# Patient Record
Sex: Female | Born: 1990
Health system: Southern US, Community
[De-identification: ages and names within clinical notes are randomized; demographics above are authoritative.]

## PROBLEM LIST (undated history)

## (undated) DIAGNOSIS — F909 Attention-deficit hyperactivity disorder, unspecified type: Secondary | ICD-10-CM

## (undated) DIAGNOSIS — J069 Acute upper respiratory infection, unspecified: Secondary | ICD-10-CM

## (undated) DIAGNOSIS — L309 Dermatitis, unspecified: Secondary | ICD-10-CM

## (undated) HISTORY — DX: Acute upper respiratory infection, unspecified: J06.9

## (undated) HISTORY — PX: ANKLE SURGERY: SHX546

## (undated) HISTORY — DX: Dermatitis, unspecified: L30.9

## (undated) HISTORY — PX: ADENOIDECTOMY: SUR15

---
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
## 2013-12-12 ENCOUNTER — Encounter (HOSPITAL_BASED_OUTPATIENT_CLINIC_OR_DEPARTMENT_OTHER): Payer: Self-pay | Admitting: Emergency Medicine

## 2013-12-12 ENCOUNTER — Emergency Department (HOSPITAL_BASED_OUTPATIENT_CLINIC_OR_DEPARTMENT_OTHER)
Admission: EM | Admit: 2013-12-12 | Discharge: 2013-12-13 | Disposition: A | Discharge status: Home / Self Care | Payer: Medicaid Other | Attending: Emergency Medicine | Admitting: Emergency Medicine

## 2013-12-12 ENCOUNTER — Emergency Department (HOSPITAL_BASED_OUTPATIENT_CLINIC_OR_DEPARTMENT_OTHER): Payer: Medicaid Other

## 2013-12-12 DIAGNOSIS — Z8659 Personal history of other mental and behavioral disorders: Secondary | ICD-10-CM | POA: Insufficient documentation

## 2013-12-12 DIAGNOSIS — R1031 Right lower quadrant pain: Secondary | ICD-10-CM | POA: Insufficient documentation

## 2013-12-12 DIAGNOSIS — N760 Acute vaginitis: Secondary | ICD-10-CM | POA: Insufficient documentation

## 2013-12-12 DIAGNOSIS — F17200 Nicotine dependence, unspecified, uncomplicated: Secondary | ICD-10-CM | POA: Insufficient documentation

## 2013-12-12 DIAGNOSIS — B9689 Other specified bacterial agents as the cause of diseases classified elsewhere: Secondary | ICD-10-CM | POA: Insufficient documentation

## 2013-12-12 DIAGNOSIS — A499 Bacterial infection, unspecified: Secondary | ICD-10-CM | POA: Insufficient documentation

## 2013-12-12 DIAGNOSIS — Z79899 Other long term (current) drug therapy: Secondary | ICD-10-CM | POA: Insufficient documentation

## 2013-12-12 DIAGNOSIS — N898 Other specified noninflammatory disorders of vagina: Secondary | ICD-10-CM | POA: Insufficient documentation

## 2013-12-12 DIAGNOSIS — Z3202 Encounter for pregnancy test, result negative: Secondary | ICD-10-CM | POA: Insufficient documentation

## 2013-12-12 DIAGNOSIS — J45909 Unspecified asthma, uncomplicated: Secondary | ICD-10-CM | POA: Insufficient documentation

## 2013-12-12 LAB — URINALYSIS, ROUTINE W REFLEX MICROSCOPIC
BILIRUBIN URINE: NEGATIVE
GLUCOSE, UA: NEGATIVE mg/dL
KETONES UR: NEGATIVE mg/dL
LEUKOCYTES UA: NEGATIVE
Nitrite: NEGATIVE
PROTEIN: NEGATIVE mg/dL
Specific Gravity, Urine: 1.021 (ref 1.005–1.030)
Urobilinogen, UA: 1 mg/dL (ref 0.0–1.0)
pH: 6.5 (ref 5.0–8.0)

## 2013-12-12 LAB — WET PREP, GENITAL
TRICH WET PREP: NONE SEEN
YEAST WET PREP: NONE SEEN

## 2013-12-12 LAB — URINE MICROSCOPIC-ADD ON

## 2013-12-12 LAB — PREGNANCY, URINE: Preg Test, Ur: NEGATIVE

## 2013-12-12 MED ORDER — METRONIDAZOLE 500 MG PO TABS: 500.0000 mg | ORAL_TABLET | Freq: Two times a day (BID) | ORAL | Status: DC

## 2013-12-12 MED ORDER — IOHEXOL 300 MG/ML  SOLN
100.0000 mL | Freq: Once | INTRAMUSCULAR | Status: AC | PRN
  Administered 2013-12-12: 100 mL via INTRAVENOUS

## 2013-12-12 NOTE — ED Provider Notes (Signed)
CSN: [id number]     Arrival date & time 12/12/13  1729 History   First MD Initiated Contact with Patient 12/12/13 1904     Chief Complaint  Patient presents with  . Abdominal Pain   (Consider location/radiation/quality/duration/timing/severity/associated sxs/prior Treatment) Patient is a 23 y.o. female presenting with abdominal pain. The history is provided by the patient. No language interpreter was used.  Abdominal Pain Pain location:  RLQ Pain quality: gnawing   Pain radiates to:  Does not radiate Pain severity:  Mild Duration:  1 week Timing:  Intermittent Progression:  Waxing and waning Chronicity:  New Associated symptoms: vaginal discharge     Past Medical History  Diagnosis Date  . Asthma   . ADHD (attention deficit hyperactivity disorder)    Past Surgical History  Procedure Laterality Date  . Ankle surgery     No family history on file. History  Substance Use Topics  . Smoking status: Current Some Day Smoker -- 0.50 packs/day    Types: Cigarettes  . Smokeless tobacco: Not on file  . Alcohol Use: Yes   OB History   Grav Para Term Preterm Abortions TAB SAB Ect Mult Living                 Review of Systems  Gastrointestinal: Positive for abdominal pain.  Genitourinary: Positive for vaginal discharge.  All other systems reviewed and are negative.    Allergies  Review of patient's allergies indicates no known allergies.  Home Medications   Current Outpatient Rx  Name  Route  Sig  Dispense  Refill  . albuterol (PROVENTIL HFA;VENTOLIN HFA) 108 (90 BASE) MCG/ACT inhaler   Inhalation   Inhale 2 puffs into the lungs every 6 (six) hours as needed. For wheezing         . diphenhydramine-acetaminophen (TYLENOL PM) 25-500 MG TABS   Oral   Take 1 tablet by mouth at bedtime as needed. For pain         . PRESCRIPTION MEDICATION   Oral   Take 1 tablet by mouth daily. Birth control          BP 122/60  Pulse 71  Temp(Src) 98.7 F (37.1 C) (Oral)   Resp 20  Ht 5\' 5"  (1.651 m)  Wt 220 lb (99.791 kg)  BMI 36.61 kg/m2  SpO2 100%  LMP 11/20/2013 Physical Exam  Nursing note and vitals reviewed. Constitutional: She is oriented to person, place, and time. She appears well-developed and well-nourished.  HENT:  Head: Normocephalic.  Eyes: Pupils are equal, round, and reactive to light.  Neck: Normal range of motion.  Cardiovascular: Normal rate and regular rhythm.   Pulmonary/Chest: Effort normal and breath sounds normal.  Abdominal: Soft. There is tenderness. There is rebound.  Genitourinary: Cervix exhibits no motion tenderness. Right adnexum displays no tenderness. Left adnexum displays no tenderness. Vaginal discharge found.  Musculoskeletal: She exhibits no edema and no tenderness.  Lymphadenopathy:    She has no cervical adenopathy.  Neurological: She is alert and oriented to person, place, and time.  Skin: Skin is warm and dry.  Psychiatric: She has a normal mood and affect. Her behavior is normal. Judgment and thought content normal.    ED Course  Procedures (including critical care time) Labs Review Labs Reviewed  URINALYSIS, ROUTINE W REFLEX MICROSCOPIC - Abnormal; Notable for the following:    APPearance CLOUDY (*)    Hgb urine dipstick TRACE (*)    All other components within normal limits  PREGNANCY, URINE  URINE MICROSCOPIC-ADD ON   Imaging Review No results found.  EKG Interpretation   None     Patient with RLQ pain with rebound.  No adnexal pain on pelvic exam.  CT scan ordered to further evaluate.  Radiology results reviewed and shared with patient.   Other than left renal lesion finding, CT without acute findings.  Patient will follow-up with her provider at the health department to schedule a MRI to evaluate the left renal lesion.  MDM  Abdominal pain. Bacterial vaginosis.    Jimmye Norman, NP 12/12/13 [phone]

## 2013-12-12 NOTE — ED Notes (Addendum)
Patient transported to CT.  Pt in NAD.  No change in pt condition. Pt ambulated to CT.

## 2013-12-12 NOTE — ED Provider Notes (Signed)
Medical screening examination/treatment/procedure(s) were performed by non-physician practitioner and as supervising physician I was immediately available for consultation/collaboration.  EKG Interpretation   None         Layla MawKristen N Atoya Andrew, DO 12/12/13 2336

## 2013-12-12 NOTE — ED Notes (Addendum)
Abdominal pain for a week. Urgency. Vaginal discharge.

## 2013-12-12 NOTE — Discharge Instructions (Signed)
Abdominal Pain, Women °Abdominal (stomach, pelvic, or belly) pain can be caused by many things. It is important to tell your doctor: °· The location of the pain. °· Does it come and go or is it present all the time? °· Are there things that start the pain (eating certain foods, exercise)? °· Are there other symptoms associated with the pain (fever, nausea, vomiting, diarrhea)? °All of this is helpful to know when trying to find the cause of the pain. °CAUSES  °· Stomach: virus or bacteria infection, or ulcer. °· Intestine: appendicitis (inflamed appendix), regional ileitis (Crohn's disease), ulcerative colitis (inflamed colon), irritable bowel syndrome, diverticulitis (inflamed diverticulum of the colon), or cancer of the stomach or intestine. °· Gallbladder disease or stones in the gallbladder. °· Kidney disease, kidney stones, or infection. °· Pancreas infection or cancer. °· Fibromyalgia (pain disorder). °· Diseases of the female organs: °· Uterus: fibroid (non-cancerous) tumors or infection. °· Fallopian tubes: infection or tubal pregnancy. °· Ovary: cysts or tumors. °· Pelvic adhesions (scar tissue). °· Endometriosis (uterus lining tissue growing in the pelvis and on the pelvic organs). °· Pelvic congestion syndrome (female organs filling up with blood just before the menstrual period). °· Pain with the menstrual period. °· Pain with ovulation (producing an egg). °· Pain with an IUD (intrauterine device, birth control) in the uterus. °· Cancer of the female organs. °· Functional pain (pain not caused by a disease, may improve without treatment). °· Psychological pain. °· Depression. °DIAGNOSIS  °Your doctor will decide the seriousness of your pain by doing an examination. °· Blood tests. °· X-rays. °· Ultrasound. °· CT scan (computed tomography, special type of X-ray). °· MRI (magnetic resonance imaging). °· Cultures, for infection. °· Barium enema (dye inserted in the large intestine, to better view it with  X-rays). °· Colonoscopy (looking in intestine with a lighted tube). °· Laparoscopy (minor surgery, looking in abdomen with a lighted tube). °· Major abdominal exploratory surgery (looking in abdomen with a large incision). °TREATMENT  °The treatment will depend on the cause of the pain.  °· Many cases can be observed and treated at home. °· Over-the-counter medicines recommended by your caregiver. °· Prescription medicine. °· Antibiotics, for infection. °· Birth control pills, for painful periods or for ovulation pain. °· Hormone treatment, for endometriosis. °· Nerve blocking injections. °· Physical therapy. °· Antidepressants. °· Counseling with a psychologist or psychiatrist. °· Minor or major surgery. °HOME CARE INSTRUCTIONS  °· Do not take laxatives, unless directed by your caregiver. °· Take over-the-counter pain medicine only if ordered by your caregiver. Do not take aspirin because it can cause an upset stomach or bleeding. °· Try a clear liquid diet (broth or water) as ordered by your caregiver. Slowly move to a bland diet, as tolerated, if the pain is related to the stomach or intestine. °· Have a thermometer and take your temperature several times a day, and record it. °· Bed rest and sleep, if it helps the pain. °· Avoid sexual intercourse, if it causes pain. °· Avoid stressful situations. °· Keep your follow-up appointments and tests, as your caregiver orders. °· If the pain does not go away with medicine or surgery, you may try: °· Acupuncture. °· Relaxation exercises (yoga, meditation). °· Group therapy. °· Counseling. °SEEK MEDICAL CARE IF:  °· You notice certain foods cause stomach pain. °· Your home care treatment is not helping your pain. °· You need stronger pain medicine. °· You want your IUD removed. °· You feel faint or   lightheaded.  You develop nausea and vomiting.  You develop a rash.  You are having side effects or an allergy to your medicine. SEEK IMMEDIATE MEDICAL CARE IF:   Your  pain does not go away or gets worse.  You have a fever.  Your pain is felt only in portions of the abdomen. The right side could possibly be appendicitis. The left lower portion of the abdomen could be colitis or diverticulitis.  You are passing blood in your stools (bright red or black tarry stools, with or without vomiting).  You have blood in your urine.  You develop chills, with or without a fever.  You pass out. MAKE SURE YOU:   Understand these instructions.  Will watch your condition.  Will get help right away if you are not doing well or get worse. Document Released: 09/24/2007 Document Revised: 02/19/2012 Document Reviewed: 10/14/2009 [facility] Patient Information 2014 [employer], [place]. Bacterial Vaginosis Bacterial vaginosis (BV) is a vaginal infection where the normal balance of bacteria in the vagina is disrupted. The normal balance is then replaced by an overgrowth of certain bacteria. There are several different kinds of bacteria that can cause BV. BV is the most common vaginal infection in women of childbearing age. CAUSES   The cause of BV is not fully understood. BV develops when there is an increase or imbalance of harmful bacteria.  Some activities or behaviors can upset the normal balance of bacteria in the vagina and put women at increased risk including:  Having a new sex partner or multiple sex partners.  Douching.  Using an intrauterine device (IUD) for contraception.  It is not clear what role sexual activity plays in the development of BV. However, women that have never had sexual intercourse are rarely infected with BV. Women do not get BV from toilet seats, bedding, swimming pools or from touching objects around them.  SYMPTOMS   Grey vaginal discharge.  A fish-like odor with discharge, especially after sexual intercourse.  Itching or burning of the vagina and vulva.  Burning or pain with urination.  Some women have no signs or symptoms at  all. DIAGNOSIS  Your caregiver must examine the vagina for signs of BV. Your caregiver will perform lab tests and look at the sample of vaginal fluid through a microscope. They will look for bacteria and abnormal cells (clue cells), a pH test higher than 4.5, and a positive amine test all associated with BV.  RISKS AND COMPLICATIONS   Pelvic inflammatory disease (PID).  Infections following gynecology surgery.  Developing HIV.  Developing herpes virus. TREATMENT  Sometimes BV will clear up without treatment. However, all women with symptoms of BV should be treated to avoid complications, especially if gynecology surgery is planned. Female partners generally do not need to be treated. However, BV may spread between female sex partners so treatment is helpful in preventing a recurrence of BV.   BV may be treated with antibiotics. The antibiotics come in either pill or vaginal cream forms. Either can be used with nonpregnant or pregnant women, but the recommended dosages differ. These antibiotics are not harmful to the baby.  BV can recur after treatment. If this happens, a second round of antibiotics will often be prescribed.  Treatment is important for pregnant women. If not treated, BV can cause a premature delivery, especially for a pregnant woman who had a premature birth in the past. All pregnant women who have symptoms of BV should be checked and treated.  For chronic reoccurrence  of BV, treatment with a type of prescribed gel vaginally twice a week is helpful. HOME CARE INSTRUCTIONS   Finish all medication as directed by your caregiver.  Do not have sex until treatment is completed.  Tell your sexual partner that you have a vaginal infection. They should see their caregiver and be treated if they have problems, such as a mild rash or itching.  Practice safe sex. Use condoms. Only have 1 sex partner. PREVENTION  Basic prevention steps can help reduce the risk of upsetting the  natural balance of bacteria in the vagina and developing BV:  Do not have sexual intercourse (be abstinent).  Do not douche.  Use all of the medicine prescribed for treatment of BV, even if the signs and symptoms go away.  Tell your sex partner if you have BV. That way, they can be treated, if needed, to prevent reoccurrence. SEEK MEDICAL CARE IF:   Your symptoms are not improving after 3 days of treatment.  You have increased discharge, pain, or fever. MAKE SURE YOU:   Understand these instructions.  Will watch your condition.  Will get help right away if you are not doing well or get worse. FOR MORE INFORMATION  Division of STD Prevention (DSTDP), Centers for Disease Control and Prevention: [employer] American [employer] Health Association (ASHA): www.ashastd.org  Document Released: 11/27/2005 Document Revised: 02/19/2012 Document Reviewed: 07/09/2013 [facility] Patient Information 2014 [place], [place].   PLEASE FOLLOW-UP WITH YOUR PROVIDER AT THE HEALTH DEPARTMENT TO SCHEDULE  A MRI FOR THE LEFT KIDNEY FINDING THAT WE DISCUSSED.

## 2013-12-12 NOTE — ED Notes (Signed)
PA at bedside.

## 2013-12-14 LAB — GC/CHLAMYDIA PROBE AMP
CT Probe RNA: NEGATIVE
GC Probe RNA: NEGATIVE

## 2014-05-27 ENCOUNTER — Emergency Department (HOSPITAL_BASED_OUTPATIENT_CLINIC_OR_DEPARTMENT_OTHER): Payer: Medicaid Other

## 2014-05-27 ENCOUNTER — Inpatient Hospital Stay (HOSPITAL_BASED_OUTPATIENT_CLINIC_OR_DEPARTMENT_OTHER)
Admission: AD | Admit: 2014-05-27 | Discharge: 2014-05-29 | DRG: 759 | Disposition: A | Discharge status: Home / Self Care | Payer: Medicaid Other | Source: Ambulatory Visit | Attending: Obstetrics and Gynecology | Admitting: Obstetrics and Gynecology

## 2014-05-27 ENCOUNTER — Encounter (HOSPITAL_BASED_OUTPATIENT_CLINIC_OR_DEPARTMENT_OTHER): Payer: Self-pay | Admitting: Emergency Medicine

## 2014-05-27 DIAGNOSIS — J45909 Unspecified asthma, uncomplicated: Secondary | ICD-10-CM

## 2014-05-27 DIAGNOSIS — F909 Attention-deficit hyperactivity disorder, unspecified type: Secondary | ICD-10-CM

## 2014-05-27 DIAGNOSIS — N730 Acute parametritis and pelvic cellulitis: Secondary | ICD-10-CM

## 2014-05-27 DIAGNOSIS — F17200 Nicotine dependence, unspecified, uncomplicated: Secondary | ICD-10-CM

## 2014-05-27 LAB — CBC WITH DIFFERENTIAL/PLATELET
BASOS ABS: 0 10*3/uL (ref 0.0–0.1)
Basophils Relative: 0 % (ref 0–1)
EOS ABS: 0.1 10*3/uL (ref 0.0–0.7)
Eosinophils Relative: 1 % (ref 0–5)
HCT: 41.2 % (ref 36.0–46.0)
Hemoglobin: 14.5 g/dL (ref 12.0–15.0)
LYMPHS ABS: 2.3 10*3/uL (ref 0.7–4.0)
Lymphocytes Relative: 22 % (ref 12–46)
MCH: 31.7 pg (ref 26.0–34.0)
MCHC: 35.2 g/dL (ref 30.0–36.0)
MCV: 90 fL (ref 78.0–100.0)
Monocytes Absolute: 0.7 10*3/uL (ref 0.1–1.0)
Monocytes Relative: 7 % (ref 3–12)
NEUTROS PCT: 71 % (ref 43–77)
Neutro Abs: 7.4 10*3/uL (ref 1.7–7.7)
PLATELETS: 292 10*3/uL (ref 150–400)
RBC: 4.58 MIL/uL (ref 3.87–5.11)
RDW: 14.4 % (ref 11.5–15.5)
WBC: 10.5 10*3/uL (ref 4.0–10.5)

## 2014-05-27 LAB — URINE MICROSCOPIC-ADD ON

## 2014-05-27 LAB — BASIC METABOLIC PANEL
BUN: 8 mg/dL (ref 6–23)
CO2: 25 mEq/L (ref 19–32)
Calcium: 10.1 mg/dL (ref 8.4–10.5)
Chloride: 101 mEq/L (ref 96–112)
Creatinine, Ser: 0.7 mg/dL (ref 0.50–1.10)
GLUCOSE: 95 mg/dL (ref 70–99)
POTASSIUM: 3.8 meq/L (ref 3.7–5.3)
SODIUM: 140 meq/L (ref 137–147)

## 2014-05-27 LAB — WET PREP, GENITAL
TRICH WET PREP: NONE SEEN
Yeast Wet Prep HPF POC: NONE SEEN

## 2014-05-27 LAB — URINALYSIS, ROUTINE W REFLEX MICROSCOPIC
BILIRUBIN URINE: NEGATIVE
GLUCOSE, UA: NEGATIVE mg/dL
KETONES UR: NEGATIVE mg/dL
Nitrite: NEGATIVE
PH: 7.5 (ref 5.0–8.0)
PROTEIN: NEGATIVE mg/dL
Specific Gravity, Urine: 1.018 (ref 1.005–1.030)
Urobilinogen, UA: 2 mg/dL — ABNORMAL HIGH (ref 0.0–1.0)

## 2014-05-27 LAB — HIV ANTIBODY (ROUTINE TESTING W REFLEX): HIV 1&2 Ab, 4th Generation: NONREACTIVE

## 2014-05-27 LAB — RPR

## 2014-05-27 LAB — PREGNANCY, URINE: Preg Test, Ur: NEGATIVE

## 2014-05-27 MED ORDER — IOHEXOL 300 MG/ML  SOLN
50.0000 mL | Freq: Once | INTRAMUSCULAR | Status: AC | PRN
Start: 2014-05-27 — End: 2014-05-27
  Administered 2014-05-27: 50 mL via ORAL

## 2014-05-27 MED ORDER — PIPERACILLIN-TAZOBACTAM 3.375 G IVPB 30 MIN
3.3750 g | Freq: Once | INTRAVENOUS | Status: AC
  Administered 2014-05-27: 3.375 g via INTRAVENOUS
  Filled 2014-05-27 (×2): qty 50

## 2014-05-27 MED ORDER — ALBUTEROL SULFATE HFA 108 (90 BASE) MCG/ACT IN AERS
INHALATION_SPRAY | RESPIRATORY_TRACT | Status: AC
  Administered 2014-05-27: 2 via RESPIRATORY_TRACT
  Filled 2014-05-27: qty 6.7

## 2014-05-27 MED ORDER — DEXTROSE 5 % IV SOLN
100.0000 mg | Freq: Two times a day (BID) | INTRAVENOUS | Status: DC
  Administered 2014-05-28 – 2014-05-29 (×3): 100 mg via INTRAVENOUS
  Filled 2014-05-27 (×3): qty 100

## 2014-05-27 MED ORDER — HYDROMORPHONE HCL PF 1 MG/ML IJ SOLN: 1.0000 mg | INTRAMUSCULAR | Status: DC | PRN

## 2014-05-27 MED ORDER — DOCUSATE SODIUM 100 MG PO CAPS
100.0000 mg | ORAL_CAPSULE | Freq: Two times a day (BID) | ORAL | Status: DC
  Administered 2014-05-27 – 2014-05-28 (×2): 100 mg via ORAL
  Filled 2014-05-27 (×3): qty 1

## 2014-05-27 MED ORDER — DOXYCYCLINE HYCLATE 100 MG IV SOLR
100.0000 mg | Freq: Once | INTRAVENOUS | Status: AC
  Administered 2014-05-27: 100 mg via INTRAVENOUS
  Filled 2014-05-27: qty 100

## 2014-05-27 MED ORDER — MORPHINE SULFATE 4 MG/ML IJ SOLN
4.0000 mg | Freq: Once | INTRAMUSCULAR | Status: AC
  Administered 2014-05-27: 4 mg via INTRAVENOUS
  Filled 2014-05-27: qty 1

## 2014-05-27 MED ORDER — IBUPROFEN 600 MG PO TABS
600.0000 mg | ORAL_TABLET | Freq: Four times a day (QID) | ORAL | Status: DC | PRN
  Administered 2014-05-28: 600 mg via ORAL
  Filled 2014-05-27: qty 1

## 2014-05-27 MED ORDER — ACETAMINOPHEN 325 MG PO TABS
650.0000 mg | ORAL_TABLET | Freq: Once | ORAL | Status: AC
  Administered 2014-05-27: 650 mg via ORAL
  Filled 2014-05-27: qty 2

## 2014-05-27 MED ORDER — MORPHINE SULFATE 4 MG/ML IJ SOLN
4.0000 mg | Freq: Once | INTRAMUSCULAR | Status: AC
  Administered 2014-05-27: 4 mg via INTRAVENOUS

## 2014-05-27 MED ORDER — SODIUM CHLORIDE 0.9 % IV SOLN
INTRAVENOUS | Status: DC
  Administered 2014-05-27: 22:00:00 via INTRAVENOUS
  Administered 2014-05-28: 100 mL via INTRAVENOUS
  Administered 2014-05-29: 03:00:00 via INTRAVENOUS

## 2014-05-27 MED ORDER — DEXTROSE 5 % IV SOLN
1.0000 g | Freq: Two times a day (BID) | INTRAVENOUS | Status: DC
  Administered 2014-05-28 (×3): 1 g via INTRAVENOUS
  Filled 2014-05-27 (×4): qty 1

## 2014-05-27 MED ORDER — ZOLPIDEM TARTRATE 5 MG PO TABS: 5.0000 mg | ORAL_TABLET | Freq: Every evening | ORAL | Status: DC | PRN

## 2014-05-27 MED ORDER — DOXYCYCLINE HYCLATE 100 MG IV SOLR: 100.0000 mg | Freq: Once | INTRAVENOUS | Status: AC

## 2014-05-27 MED ORDER — ONDANSETRON HCL 4 MG/2ML IJ SOLN
4.0000 mg | Freq: Once | INTRAMUSCULAR | Status: AC
  Administered 2014-05-27: 4 mg via INTRAVENOUS
  Filled 2014-05-27: qty 2

## 2014-05-27 MED ORDER — OXYCODONE-ACETAMINOPHEN 5-325 MG PO TABS
1.0000 | ORAL_TABLET | ORAL | Status: DC | PRN
  Administered 2014-05-27 – 2014-05-28 (×3): 2 via ORAL
  Filled 2014-05-27 (×3): qty 2

## 2014-05-27 MED ORDER — SODIUM CHLORIDE 0.9 % IV BOLUS (SEPSIS)
1000.0000 mL | Freq: Once | INTRAVENOUS | Status: AC
  Administered 2014-05-27: 1000 mL via INTRAVENOUS

## 2014-05-27 MED ORDER — ALBUTEROL SULFATE (2.5 MG/3ML) 0.083% IN NEBU
3.0000 mL | INHALATION_SOLUTION | Freq: Four times a day (QID) | RESPIRATORY_TRACT | Status: DC | PRN
  Administered 2014-05-27 – 2014-05-28 (×2): 3 mL via RESPIRATORY_TRACT
  Filled 2014-05-27 (×2): qty 3

## 2014-05-27 MED ORDER — PRENATAL MULTIVITAMIN CH
1.0000 | ORAL_TABLET | Freq: Every day | ORAL | Status: DC
  Administered 2014-05-28: 1 via ORAL
  Filled 2014-05-27: qty 1

## 2014-05-27 MED ORDER — MORPHINE SULFATE 4 MG/ML IJ SOLN
INTRAMUSCULAR | Status: AC
  Filled 2014-05-27: qty 1

## 2014-05-27 MED ORDER — IOHEXOL 300 MG/ML  SOLN
100.0000 mL | Freq: Once | INTRAMUSCULAR | Status: AC | PRN
  Administered 2014-05-27: 100 mL via INTRAVENOUS

## 2014-05-27 MED ORDER — ALBUTEROL SULFATE HFA 108 (90 BASE) MCG/ACT IN AERS
2.0000 | INHALATION_SPRAY | Freq: Four times a day (QID) | RESPIRATORY_TRACT | Status: DC | PRN
  Administered 2014-05-27: 2 via RESPIRATORY_TRACT
  Filled 2014-05-27: qty 6.7

## 2014-05-27 NOTE — ED Provider Notes (Signed)
CSN: [id number]     Arrival date & time 05/27/14  1452 History   First MD Initiated Contact with Patient 05/27/14 1515     Chief Complaint  Patient presents with  . Abdominal Pain     (Consider location/radiation/quality/duration/timing/severity/associated sxs/prior Treatment) Patient is a 23 y.o. female presenting with abdominal pain. The history is provided by the patient and medical records. No language interpreter was used.  Abdominal Pain Associated symptoms: nausea and vaginal discharge   Associated symptoms: no chest pain, no constipation, no cough, no diarrhea, no dysuria, no fatigue, no fever, no hematuria, no shortness of breath and no vomiting     Deborah Walters is a 23 y.o. female  with a hx of asthma, ADHD presents to the Emergency Department complaining of gradual, persistent, progressively worsening lower abdominal pain onset 3 days ago. Patient reports at the same time she also began to have vaginal pain. Patient reports she's with a new sexual partner. She and her female partner are not using condoms or contraception.  Patient reports nausea without vomiting, subjective fevers and chills at home. She denies copious amounts of vaginal discharge. No aggravating or alleviating factors. Patient denies headache neck pain, chest pain, shortness of breath, vomiting, diarrhea weakness, dizziness, syncope.  Past Medical History  Diagnosis Date  . Asthma   . ADHD (attention deficit hyperactivity disorder)    Past Surgical History  Procedure Laterality Date  . Ankle surgery     No family history on file. History  Substance Use Topics  . Smoking status: Current Some Day Smoker -- 0.50 packs/day    Types: Cigarettes  . Smokeless tobacco: Not on file  . Alcohol Use: Yes   OB History   Grav Para Term Preterm Abortions TAB SAB Ect Mult Living                 Review of Systems  Constitutional: Negative for fever, diaphoresis, appetite change, fatigue and unexpected weight change.    HENT: Negative for mouth sores and trouble swallowing.   Respiratory: Negative for cough, chest tightness, shortness of breath, wheezing and stridor.   Cardiovascular: Negative for chest pain and palpitations.  Gastrointestinal: Positive for nausea and abdominal pain. Negative for vomiting, diarrhea, constipation, blood in stool, abdominal distention and rectal pain.  Genitourinary: Positive for vaginal discharge. Negative for dysuria, urgency, frequency, hematuria, flank pain and difficulty urinating.  Musculoskeletal: Negative for back pain, neck pain and neck stiffness.  Skin: Negative for rash.  Neurological: Negative for weakness.  Hematological: Negative for adenopathy.  Psychiatric/Behavioral: Negative for confusion.  All other systems reviewed and are negative.     Allergies  Review of patient's allergies indicates no known allergies.  Home Medications   Prior to Admission medications   Medication Sig Start Date End Date Taking? Authorizing Provider  albuterol (PROVENTIL HFA;VENTOLIN HFA) 108 (90 BASE) MCG/ACT inhaler Inhale 2 puffs into the lungs every 6 (six) hours as needed. For wheezing    Historical Provider, MD  diphenhydramine-acetaminophen (TYLENOL PM) 25-500 MG TABS Take 1 tablet by mouth at bedtime as needed. For pain    Historical Provider, MD  metroNIDAZOLE (FLAGYL) 500 MG tablet Take 1 tablet (500 mg total) by mouth 2 (two) times daily. 12/12/13   Jimmye Normanavid John Smith, NP  PRESCRIPTION MEDICATION Take 1 tablet by mouth daily. Birth control    Historical Provider, MD   BP 133/82  Pulse 99  Temp(Src) 101.9 F (38.8 C) (Rectal)  Resp 18  Ht 5\' 5"  (1.651  m)  Wt 220 lb (99.791 kg)  BMI 36.61 kg/m2  SpO2 99%  LMP 05/12/2014 Physical Exam  Nursing note and vitals reviewed. Constitutional: She is oriented to person, place, and time. She appears well-developed and well-nourished. No distress.  Awake, alert, nontoxic appearance  HENT:  Head: Normocephalic and  atraumatic.  Mouth/Throat: Oropharynx is clear and moist. No oropharyngeal exudate.  Eyes: Conjunctivae are normal. No scleral icterus.  Neck: Normal range of motion. Neck supple.  Cardiovascular: Normal rate, regular rhythm, normal heart sounds and intact distal pulses.   No murmur heard. Pulmonary/Chest: Effort normal and breath sounds normal. No respiratory distress. She has no wheezes.  Abdominal: Soft. Bowel sounds are normal. She exhibits no distension and no mass. There is tenderness in the right lower quadrant, periumbilical area and suprapubic area. There is guarding. There is no rigidity, no rebound, no CVA tenderness and negative Murphy's sign. Hernia confirmed negative in the right inguinal area and confirmed negative in the left inguinal area.    Abdomen soft with significant guarding in the right lower quadrant, rebound tenderness; no peritoneal signs  Genitourinary: There is no rash, tenderness, lesion or injury on the right labia. There is no rash, tenderness, lesion or injury on the left labia. Uterus is tender. Uterus is not deviated, not enlarged and not fixed. Cervix exhibits motion tenderness and discharge. Cervix exhibits no friability. Right adnexum displays no mass, no tenderness and no fullness. Left adnexum displays no mass. No erythema, tenderness or bleeding around the vagina. No foreign body around the vagina. No signs of injury around the vagina. Vaginal discharge found.  Copious amounts of purulent discharge from the cervical os Significant cervical motion tenderness, positive chandelier sign Tenderness to palpation of the uterus without enlargement  Musculoskeletal: Normal range of motion. She exhibits no edema.  Lymphadenopathy:    She has no cervical adenopathy.       Right: No inguinal adenopathy present.       Left: No inguinal adenopathy present.  Neurological: She is alert and oriented to person, place, and time. She exhibits normal muscle tone. Coordination  normal.  Speech is clear and goal oriented Moves extremities without ataxia  Skin: Skin is warm and dry. She is not diaphoretic. No erythema.  Psychiatric: She has a normal mood and affect.    ED Course  Procedures (including critical care time) Labs Review Labs Reviewed  WET PREP, GENITAL - Abnormal; Notable for the following:    Clue Cells Wet Prep HPF POC FEW (*)    WBC, Wet Prep HPF POC MANY (*)    All other components within normal limits  URINALYSIS, ROUTINE W REFLEX MICROSCOPIC - Abnormal; Notable for the following:    APPearance CLOUDY (*)    Hgb urine dipstick SMALL (*)    Urobilinogen, UA 2.0 (*)    Leukocytes, UA LARGE (*)    All other components within normal limits  URINE MICROSCOPIC-ADD ON - Abnormal; Notable for the following:    Squamous Epithelial / LPF FEW (*)    Bacteria, UA FEW (*)    All other components within normal limits  GC/CHLAMYDIA PROBE AMP  PREGNANCY, URINE  CBC WITH DIFFERENTIAL  BASIC METABOLIC PANEL  RPR  HIV ANTIBODY (ROUTINE TESTING)    Imaging Review Ct Abdomen Pelvis W Contrast  05/27/2014   CLINICAL DATA:  Lower abdominal pain  EXAM: CT ABDOMEN AND PELVIS WITH CONTRAST  TECHNIQUE: Multidetector CT imaging of the abdomen and pelvis was performed using the standard  protocol following bolus administration of intravenous contrast. Oral contrast was also administered.  CONTRAST:  100mL OMNIPAQUE IOHEXOL 300 MG/ML  SOLN  COMPARISON:  December 12, 2013  FINDINGS: The lung bases are clear.  No focal liver lesions are identified. There is no biliary duct dilatation. Gallbladder wall is not thickened.  Spleen, pancreas, and adrenals appear normal.  The previously noted mass arising from the upper pole of the left kidney measuring 1.5 x 1.3 cm remains stable. It has attenuation values higher than is expected with a simple cyst and may be showing slight enhancement. No new renal mass is identified. There is no hydronephrosis on either side. There is no renal  or ureteral calculus on either side.  Appears within normal limits.  There is no bowel obstruction. There is no free air or portal venous air.  There is no ascites, adenopathy, or abscess in the abdomen or pelvis. There is no evidence of abdominal aortic aneurysm. There is no periaortic fluid there are no blastic or lytic bone lesions.  IMPRESSION: The previously noted mass arising from the upper pole left kidney anteriorly is stable. This mass cannot be classified as a simple cyst. The previous recommendation for pre and post-contrast renal MR to better evaluate this lesion remains in effect. A slow-growing neoplasm has not been excluded in this area, although admittedly renal neoplasm is unusual in this age group.  There is no mesenteric thickening or abscess. No bowel obstruction. Appendix appears normal. No hydronephrosis. No renal or ureteral calculi.   Electronically Signed   By: Bretta BangWilliam  Woodruff M.D.   On: 05/27/2014 17:43     EKG Interpretation None      MDM   Final diagnoses:  PID (acute pelvic inflammatory disease)    Deborah Walters presents with lower abdominal pain worse in the right lower quadrant and vaginal pain.  Patient with new sexual partner and previous history of STDs.    Patient febrile to 102 with cervical motion tenderness and. Discharge noted from the cervical os.  Obtain CT scan. Pregnancy test negative.  Her blood cell count of 10.5. Wet prep with many white blood cells.  Urine sent for culture.   6:31 PM CT without evidence of tubo-ovarian abscess however; of note there is a mass arising from the upper pole of the left kidney which is stable since January but cannot be classified as a simple cyst. Recommend pre-and postcontrast renal MRI for further differentiation which will need to be done at a later date.  6:33 PM [facility] He agrees with admission. Patient will be transferred via care link for hospitalization and IV  antibiotics. Doxycycline and Zosyn begun here in the emergency department.  BP 133/82  Pulse 99  Temp(Src) 101.9 F (38.8 C) (Rectal)  Resp 18  Ht 5\' 5"  (1.651 m)  Wt 220 lb (99.791 kg)  BMI 36.61 kg/m2  SpO2 99%  LMP 05/12/2014     Dierdre ForthHannah Muthersbaugh, PA-C 05/27/14 1834

## 2014-05-27 NOTE — ED Provider Notes (Signed)
Medical screening examination/treatment/procedure(s) were performed by non-physician practitioner and as supervising physician I was immediately available for consultation/collaboration.     Douglas Delo, MD 05/27/14 2315 

## 2014-05-27 NOTE — ED Notes (Signed)
Patient asked to undress from waist down, pelvic cart at bedside.

## 2014-05-27 NOTE — ED Notes (Addendum)
Pt c/o lower abdominal pain since Saturday and pain now diffuse with radiation to the back. Pt reports having her period x 2 wks. Pt had normal pap at health dept in March. Denies n/v/d. Last bowel movement yesterday.denies dysuria. Pt sts she is out of inhaler and might need a breathing treatment.

## 2014-05-27 NOTE — H&P (Signed)
Deborah Walters is an 23 y.o. female. She is admitted for acute PID  Pertinent Gynecological History: Menses: regular every month with spotting approximately 7 days per month Bleeding: this month the cycle was long Contraception: none DES exposure: unknown Blood transfusions: none Sexually transmitted diseases: no past history Previous GYN Procedures:   Last mammogram:  Date:  Last pap:  Date:  OB History: G00 P0   Menstrual History: Menarche age:  Patient's last menstrual period was 05/12/2014.    Past Medical History  Diagnosis Date  . Asthma   . ADHD (attention deficit hyperactivity disorder)     Past Surgical History  Procedure Laterality Date  . Ankle surgery      No family history on file.  Social History:  reports that she has been smoking Cigarettes.  She has been smoking about 0.50 packs per day. She does not have any smokeless tobacco history on file. She reports that she drinks alcohol. She reports that she does not use illicit drugs.  Allergies: No Known Allergies  Prescriptions prior to admission  Medication Sig Dispense Refill  . albuterol (PROVENTIL HFA;VENTOLIN HFA) 108 (90 BASE) MCG/ACT inhaler Inhale 2 puffs into the lungs every 6 (six) hours as needed. For wheezing      . ibuprofen (ADVIL,MOTRIN) 200 MG tablet Take 400 mg by mouth every 6 (six) hours as needed for moderate pain.        Review of Systems  Constitutional: Positive for fever and chills.  HENT: Negative.   Respiratory: Negative.   Cardiovascular: Negative.   Gastrointestinal: Positive for nausea.       Began to hurt last Saturday 5d PTA, with low grade fever described.  Genitourinary:       Lmp 2 June 15  X 12 days, normally 7 days  Psychiatric/Behavioral: Negative for depression.    Blood pressure 128/54, pulse 77, temperature 98.6 F (37 C), temperature source Oral, resp. rate 18, height 5\' 5"  (1.651 m), weight 220 lb (99.791 kg), last menstrual period 05/12/2014, SpO2  100.00%. Physical Exam  Constitutional: She is oriented to person, place, and time. She appears well-developed.  HENT:  Head: Normocephalic.  Neck: Normal range of motion.  Cardiovascular: Normal rate.   Respiratory: Effort normal.  GI: Soft. She exhibits no mass. There is tenderness. There is rebound and guarding.  Genitourinary:  See ED MD eval . GC/Chl collected also wet prep  Neurological: She is alert and oriented to person, place, and time.  Skin: Skin is warm and dry.  Psychiatric: She has a normal mood and affect. Her behavior is normal. Judgment and thought content normal.      Results for orders placed during the hospital encounter of 05/27/14 (from the past 24 hour(s))  URINALYSIS, ROUTINE W REFLEX MICROSCOPIC     Status: Abnormal   Collection Time    05/27/14  3:06 PM      Result Value Ref Range   Color, Urine YELLOW  YELLOW   APPearance CLOUDY (*) CLEAR   Specific Gravity, Urine 1.018  1.005 - 1.030   pH 7.5  5.0 - 8.0   Glucose, UA NEGATIVE  NEGATIVE mg/dL   Hgb urine dipstick SMALL (*) NEGATIVE   Bilirubin Urine NEGATIVE  NEGATIVE   Ketones, ur NEGATIVE  NEGATIVE mg/dL   Protein, ur NEGATIVE  NEGATIVE mg/dL   Urobilinogen, UA 2.0 (*) 0.0 - 1.0 mg/dL   Nitrite NEGATIVE  NEGATIVE   Leukocytes, UA LARGE (*) NEGATIVE  PREGNANCY, URINE  Status: None   Collection Time    05/27/14  3:06 PM      Result Value Ref Range   Preg Test, Ur NEGATIVE  NEGATIVE  URINE MICROSCOPIC-ADD ON     Status: Abnormal   Collection Time    05/27/14  3:06 PM      Result Value Ref Range   Squamous Epithelial / LPF FEW (*) RARE   WBC, UA 11-20  <3 WBC/hpf   RBC / HPF 3-6  <3 RBC/hpf   Bacteria, UA FEW (*) RARE  RPR     Status: None   Collection Time    05/27/14  4:19 PM      Result Value Ref Range   RPR NON REAC  NON REAC  HIV ANTIBODY (ROUTINE TESTING)     Status: None   Collection Time    05/27/14  4:19 PM      Result Value Ref Range   HIV 1&2 Ab, 4th Generation  NONREACTIVE  NONREACTIVE  CBC WITH DIFFERENTIAL     Status: None   Collection Time    05/27/14  4:19 PM      Result Value Ref Range   WBC 10.5  4.0 - 10.5 K/uL   RBC 4.58  3.87 - 5.11 MIL/uL   Hemoglobin 14.5  12.0 - 15.0 g/dL   HCT 16.1  09.6 - 04.5 %   MCV 90.0  78.0 - 100.0 fL   MCH 31.7  26.0 - 34.0 pg   MCHC 35.2  30.0 - 36.0 g/dL   RDW 40.9  81.1 - 91.4 %   Platelets 292  150 - 400 K/uL   Neutrophils Relative % 71  43 - 77 %   Neutro Abs 7.4  1.7 - 7.7 K/uL   Lymphocytes Relative 22  12 - 46 %   Lymphs Abs 2.3  0.7 - 4.0 K/uL   Monocytes Relative 7  3 - 12 %   Monocytes Absolute 0.7  0.1 - 1.0 K/uL   Eosinophils Relative 1  0 - 5 %   Eosinophils Absolute 0.1  0.0 - 0.7 K/uL   Basophils Relative 0  0 - 1 %   Basophils Absolute 0.0  0.0 - 0.1 K/uL  BASIC METABOLIC PANEL     Status: None   Collection Time    05/27/14  4:19 PM      Result Value Ref Range   Sodium 140  137 - 147 mEq/L   Potassium 3.8  3.7 - 5.3 mEq/L   Chloride 101  96 - 112 mEq/L   CO2 25  19 - 32 mEq/L   Glucose, Bld 95  70 - 99 mg/dL   BUN 8  6 - 23 mg/dL   Creatinine, Ser 7.82  0.50 - 1.10 mg/dL   Calcium 95.6  8.4 - 21.3 mg/dL   GFR calc non Af Amer >90  >90 mL/min   GFR calc Af Amer >90  >90 mL/min  WET PREP, GENITAL     Status: Abnormal   Collection Time    05/27/14  4:40 PM      Result Value Ref Range   Yeast Wet Prep HPF POC NONE SEEN  NONE SEEN   Trich, Wet Prep NONE SEEN  NONE SEEN   Clue Cells Wet Prep HPF POC FEW (*) NONE SEEN   WBC, Wet Prep HPF POC MANY (*) NONE SEEN    Ct Abdomen Pelvis W Contrast  05/27/2014   CLINICAL DATA:  Lower abdominal  pain  EXAM: CT ABDOMEN AND PELVIS WITH CONTRAST  TECHNIQUE: Multidetector CT imaging of the abdomen and pelvis was performed using the standard protocol following bolus administration of intravenous contrast. Oral contrast was also administered.  CONTRAST:  100mL OMNIPAQUE IOHEXOL 300 MG/ML  SOLN  COMPARISON:  December 12, 2013  FINDINGS: The lung  bases are clear.  No focal liver lesions are identified. There is no biliary duct dilatation. Gallbladder wall is not thickened.  Spleen, pancreas, and adrenals appear normal.  The previously noted mass arising from the upper pole of the left kidney measuring 1.5 x 1.3 cm remains stable. It has attenuation values higher than is expected with a simple cyst and may be showing slight enhancement. No new renal mass is identified. There is no hydronephrosis on either side. There is no renal or ureteral calculus on either side.  Appears within normal limits.  There is no bowel obstruction. There is no free air or portal venous air.  There is no ascites, adenopathy, or abscess in the abdomen or pelvis. There is no evidence of abdominal aortic aneurysm. There is no periaortic fluid there are no blastic or lytic bone lesions.  IMPRESSION: The previously noted mass arising from the upper pole left kidney anteriorly is stable. This mass cannot be classified as a simple cyst. The previous recommendation for pre and post-contrast renal MR to better evaluate this lesion remains in effect. A slow-growing neoplasm has not been excluded in this area, although admittedly renal neoplasm is unusual in this age group.  There is no mesenteric thickening or abscess. No bowel obstruction. Appendix appears normal. No hydronephrosis. No renal or ureteral calculi.   Electronically Signed   By: Bretta BangWilliam  Woodruff M.D.   On: 05/27/2014 17:43    Assessment/Plan: Acute PID. First episode Admit for IV antibiotic tx til afebrile and reduced discomfort.  FERGUSON,JOHN V 05/27/2014, 9:11 PM

## 2014-05-28 LAB — CBC
HCT: 38.5 % (ref 36.0–46.0)
Hemoglobin: 13.4 g/dL (ref 12.0–15.0)
MCH: 31.3 pg (ref 26.0–34.0)
MCHC: 34.8 g/dL (ref 30.0–36.0)
MCV: 90 fL (ref 78.0–100.0)
PLATELETS: 275 10*3/uL (ref 150–400)
RBC: 4.28 MIL/uL (ref 3.87–5.11)
RDW: 14.7 % (ref 11.5–15.5)
WBC: 10.3 10*3/uL (ref 4.0–10.5)

## 2014-05-28 LAB — RAPID URINE DRUG SCREEN, HOSP PERFORMED
Amphetamines: NOT DETECTED
BENZODIAZEPINES: NOT DETECTED
Barbiturates: NOT DETECTED
COCAINE: NOT DETECTED
Opiates: POSITIVE — AB
Tetrahydrocannabinol: NOT DETECTED

## 2014-05-28 MED ORDER — PROMETHAZINE HCL 25 MG/ML IJ SOLN
25.0000 mg | Freq: Four times a day (QID) | INTRAMUSCULAR | Status: DC | PRN
  Administered 2014-05-28: 25 mg via INTRAVENOUS
  Filled 2014-05-28: qty 1

## 2014-05-28 NOTE — Progress Notes (Signed)
UR completed 

## 2014-05-28 NOTE — Progress Notes (Signed)
Hosp day 2 for PID Dx,  Subjective: Patient reports improved abd pain, hungry.    Objective: I have reviewed patient's vital signs, medications, labs and microbiology.  General: alert, cooperative and mild distress GI: soft, non-tender; bowel sounds normal; no masses,  no organomegaly and abnormal findings:  rebound tenderness and noted in the lower quadrants, improved from admission Will be able to be discharged Friday   Assessment/Plan: Continue IV abx x 24 more hours, then d/c on p.o. meds   LOS: 1 day    FERGUSON,JOHN V 05/28/2014, 9:01 AM

## 2014-05-29 DIAGNOSIS — N730 Acute parametritis and pelvic cellulitis: Principal | ICD-10-CM

## 2014-05-29 MED ORDER — IBUPROFEN 600 MG PO TABS: 600.0000 mg | ORAL_TABLET | Freq: Four times a day (QID) | ORAL | Status: DC | PRN

## 2014-05-29 MED ORDER — DOXYCYCLINE HYCLATE 100 MG PO CAPS: 100.0000 mg | ORAL_CAPSULE | Freq: Two times a day (BID) | ORAL | Status: DC

## 2014-05-29 NOTE — Discharge Instructions (Signed)
Pelvic Inflammatory Disease °Pelvic inflammatory disease (PID) refers to an infection in some or all of the female organs. The infection can be in the uterus, ovaries, fallopian tubes, or the surrounding tissues in the pelvis. PID can cause abdominal or pelvic pain that comes on suddenly (acute pelvic pain). PID is a serious infection because it can lead to lasting (chronic) pelvic pain or the inability to have children (infertile).  °CAUSES  °The infection is often caused by the normal bacteria found in the vaginal tissues. PID may also be caused by an infection that is spread during sexual contact. PID can also occur following:  °· The birth of a baby.   °· A miscarriage.   °· An abortion.   °· Major pelvic surgery.   °· The use of an intrauterine device (IUD).   °· A sexual assault.   °RISK FACTORS °Certain factors can put a person at higher risk for PID, such as: °· Being younger than 25 years. °· Being sexually active at a young age. °· Using nonbarrier contraception. °· Having multiple sexual partners. °· Having sex with someone who has symptoms of a genital infection. °· Using oral contraception. °Other times, certain behaviors can increase the possibility of getting PID, such as: °· Having sex during your period. °· Using a vaginal douche. °· Having an intrauterine device (IUD) in place. °SYMPTOMS  °· Abdominal or pelvic pain.   °· Fever.   °· Chills.   °· Abnormal vaginal discharge. °· Abnormal uterine bleeding.   °· Unusual pain shortly after finishing your period. °DIAGNOSIS  °Your caregiver will choose some of the following methods to make a diagnosis, such as:  °· Performing a physical exam and history. A pelvic exam typically reveals a very tender uterus and surrounding pelvis.   °· Ordering laboratory tests including a pregnancy test, blood tests, and urine test.  °· Ordering cultures of the vagina and cervix to check for a sexually transmitted infection (STI). °· Performing an ultrasound.    °· Performing a laparoscopic procedure to look inside the pelvis.   °TREATMENT  °· Antibiotic medicines may be prescribed and taken by mouth.   °· Sexual partners may be treated when the infection is caused by a sexually transmitted disease (STD).   °· Hospitalization may be needed to give antibiotics intravenously. °· Surgery may be needed, but this is rare. °It may take weeks until you are completely well. If you are diagnosed with PID, you should also be checked for human immunodeficiency virus (HIV).   °HOME CARE INSTRUCTIONS  °· If given, take your antibiotics as directed. Finish the medicine even if you start to feel better.   °· Only take over-the-counter or prescription medicines for pain, discomfort, or fever as directed by your caregiver.   °· Do not have sexual intercourse until treatment is completed or as directed by your caregiver. If PID is confirmed, your recent sexual partner(s) will need treatment.   °· Keep your follow-up appointments. °SEEK MEDICAL CARE IF:  °· You have increased or abnormal vaginal discharge.   °· You need prescription medicine for your pain.   °· You vomit.   °· You cannot take your medicines.   °· Your partner has an STD.   °SEEK IMMEDIATE MEDICAL CARE IF:  °· You have a fever.   °· You have increased abdominal or pelvic pain.   °· You have chills.   °· You have pain when you urinate.   °· You are not better after 72 hours following treatment.   °MAKE SURE YOU:  °· Understand these instructions. °· Will watch your condition. °· Will get help right away if you are not doing well or get worse. °  Document Released: 11/27/2005 Document Revised: 03/24/2013 Document Reviewed: 11/23/2011 °[employer]® Patient Information ©2015 [employer]. This information is not intended to replace advice given to you by your health care provider. Make sure you discuss any questions you have with your health care provider. ° °

## 2014-05-29 NOTE — Discharge Summary (Signed)
Physician Discharge Summary  Patient ID: Deborah Walters MRN: [id number] DOB/AGE: 31-Aug-1991 23 y.o.  Admit date: 05/27/2014 Discharge date: 05/29/2014  Admission Diagnoses:Acute PID   Discharge Diagnoses: Same Active Problems:   PID (acute pelvic inflammatory disease)   Discharged Condition: fair  Hospital Course: 23 y.o.G0P0 Patient's last menstrual period was 05/12/2014. Admitted with lower abdominal pain and diagnosis of acute PID for IV antibiotic therapy. She improved and is ready for discharge on oral antibiotics     Consults: None  Significant Diagnostic Studies: labs:  RPR and HIV neg, CT no pelvic findings CBC    Component Value Date/Time   WBC 10.3 05/28/2014 0548   RBC 4.28 05/28/2014 0548   HGB 13.4 05/28/2014 0548   HCT 38.5 05/28/2014 0548   PLT 275 05/28/2014 0548   MCV 90.0 05/28/2014 0548   MCH 31.3 05/28/2014 0548   MCHC 34.8 05/28/2014 0548   RDW 14.7 05/28/2014 0548   LYMPHSABS 2.3 05/27/2014 1619   MONOABS 0.7 05/27/2014 1619   EOSABS 0.1 05/27/2014 1619   BASOSABS 0.0 05/27/2014 1619      Treatments: IV hydration and antibiotics: cefotan and doxycycline  Discharge Exam: Blood pressure 125/70, pulse 51, temperature 97.9 F (36.6 C), temperature source Oral, resp. rate 18, height 5\' 5"  (1.651 m), weight 98.884 kg (218 lb), last menstrual period 05/12/2014, SpO2 100.00%. General appearance: alert, cooperative and no distress GI: soft, non-tender; bowel sounds normal; no masses,  no organomegaly Extremities: extremities normal, atraumatic, no cyanosis or edema  Disposition: 01-Home or Self Care     Medication List         albuterol 108 (90 BASE) MCG/ACT inhaler  Commonly known as:  PROVENTIL HFA;VENTOLIN HFA  Inhale 2 puffs into the lungs every 6 (six) hours as needed. For wheezing     doxycycline 100 MG capsule  Commonly known as:  VIBRAMYCIN  Take 1 capsule (100 mg total) by mouth 2 (two) times daily.     ibuprofen 600 MG tablet  Commonly known  as:  ADVIL,MOTRIN  Take 1 tablet (600 mg total) by mouth every 6 (six) hours as needed (mild pain).           Follow-up Information   Follow up with [facility] In 2 weeks.   Contact information:   [address] [place] [id number] [phone]       Signed: Scheryl DarterRNOLD,JAMES 05/29/2014, 7:54 AM

## 2014-05-29 NOTE — Progress Notes (Signed)
Pt ambulated out teaching complete  

## 2014-05-30 LAB — GC/CHLAMYDIA PROBE AMP
CT Probe RNA: NEGATIVE
GC PROBE AMP APTIMA: POSITIVE — AB

## 2016-10-05 ENCOUNTER — Encounter (HOSPITAL_BASED_OUTPATIENT_CLINIC_OR_DEPARTMENT_OTHER): Payer: Self-pay

## 2016-10-05 ENCOUNTER — Emergency Department (HOSPITAL_BASED_OUTPATIENT_CLINIC_OR_DEPARTMENT_OTHER)
Admission: EM | Admit: 2016-10-05 | Discharge: 2016-10-05 | Disposition: A | Discharge status: Home / Self Care | Payer: Medicaid Other | Attending: Emergency Medicine | Admitting: Emergency Medicine

## 2016-10-05 ENCOUNTER — Emergency Department (HOSPITAL_BASED_OUTPATIENT_CLINIC_OR_DEPARTMENT_OTHER): Payer: Medicaid Other

## 2016-10-05 DIAGNOSIS — J45909 Unspecified asthma, uncomplicated: Secondary | ICD-10-CM | POA: Insufficient documentation

## 2016-10-05 DIAGNOSIS — R05 Cough: Secondary | ICD-10-CM | POA: Diagnosis present

## 2016-10-05 DIAGNOSIS — F17210 Nicotine dependence, cigarettes, uncomplicated: Secondary | ICD-10-CM | POA: Diagnosis not present

## 2016-10-05 DIAGNOSIS — R69 Illness, unspecified: Secondary | ICD-10-CM

## 2016-10-05 DIAGNOSIS — J111 Influenza due to unidentified influenza virus with other respiratory manifestations: Secondary | ICD-10-CM | POA: Insufficient documentation

## 2016-10-05 DIAGNOSIS — F909 Attention-deficit hyperactivity disorder, unspecified type: Secondary | ICD-10-CM | POA: Diagnosis not present

## 2016-10-05 MED ORDER — IBUPROFEN 400 MG PO TABS
600.0000 mg | ORAL_TABLET | Freq: Once | ORAL | Status: AC
  Administered 2016-10-05: 600 mg via ORAL
  Filled 2016-10-05: qty 1

## 2016-10-05 MED ORDER — ACETAMINOPHEN 325 MG PO TABS
650.0000 mg | ORAL_TABLET | Freq: Once | ORAL | Status: AC
  Administered 2016-10-05: 650 mg via ORAL
  Filled 2016-10-05: qty 2

## 2016-10-05 MED ORDER — IPRATROPIUM-ALBUTEROL 0.5-2.5 (3) MG/3ML IN SOLN
3.0000 mL | Freq: Four times a day (QID) | RESPIRATORY_TRACT | Status: DC
  Administered 2016-10-05: 3 mL via RESPIRATORY_TRACT
  Filled 2016-10-05: qty 3

## 2016-10-05 MED ORDER — NAPROXEN 500 MG PO TABS: 500.0000 mg | ORAL_TABLET | Freq: Two times a day (BID) | ORAL | 0 refills | Status: DC

## 2016-10-05 NOTE — Discharge Instructions (Signed)
Follow up with a primary care doctor next week if the symptoms are not improving, rest, drink plenty of fluids

## 2016-10-05 NOTE — ED Notes (Signed)
C/o cough, fever, body aches onset yesterday

## 2016-10-05 NOTE — ED Provider Notes (Signed)
[facility]   CSN: [id number] Arrival date & time: 10/05/16  2051  By signing my name below, I, Phillis HaggisGabriella Gaje, attest that this documentation has been prepared under the direction and in the presence of Linwood DibblesJon Bindu Docter, MD. Electronically Signed: Phillis HaggisGabriella Gaje, ED Scribe. 10/05/16. 9:15 PM.  History   Chief Complaint Chief Complaint  Patient presents with  . Generalized Body Aches   The history is provided by the patient. No language interpreter was used.  Cough  This is a new problem. The current episode started yesterday. The problem occurs constantly. The problem has been gradually worsening. The cough is non-productive. The maximum temperature recorded prior to her arrival was 101 to 101.9 F. The fever has been present for less than 1 day. Associated symptoms include myalgias. Pertinent negatives include no ear pain, no headaches and no sore throat. She has tried nothing for the symptoms. Her past medical history is significant for asthma.   HPI Comments: Deborah Planeyler Walters is a 25 y.o. female with a hx of asthma who presents to the Emergency Department complaining of gradually worsening generalized myalgias onset one day ago. Pt reports associated fever tmax 101.4 F, chest congestion, and dry cough. She has not tried anything for her symptoms. She denies sore throat, ear pain, nausea, vomiting, or headache. Pt is on Depo BC.   Past Medical History:  Diagnosis Date  . ADHD (attention deficit hyperactivity disorder)   . Asthma     Patient Active Problem List   Diagnosis Date Noted  . PID (acute pelvic inflammatory disease) 05/27/2014    Past Surgical History:  Procedure Laterality Date  . ANKLE SURGERY      OB History    Gravida Para Term Preterm AB Living   0 0           SAB TAB Ectopic Multiple Live Births                 Home Medications    Prior to Admission medications   Medication Sig Start Date End Date Taking? Authorizing Provider  naproxen  (NAPROSYN) 500 MG tablet Take 1 tablet (500 mg total) by mouth 2 (two) times daily with a meal. 10/05/16   Linwood DibblesJon Elliyah Liszewski, MD    Family History No family history on file.  Social History Social History  Substance Use Topics  . Smoking status: Current Some Day Smoker    Packs/day: 0.00    Types: Cigarettes  . Smokeless tobacco: Never Used  . Alcohol use Yes     Comment: occ     Allergies   Review of patient's allergies indicates no known allergies.   Review of Systems Review of Systems  Constitutional: Positive for fever.  HENT: Positive for congestion. Negative for ear pain and sore throat.   Respiratory: Positive for cough.   Gastrointestinal: Negative for nausea and vomiting.  Musculoskeletal: Positive for myalgias.  Neurological: Negative for headaches.   Physical Exam Updated Vital Signs BP 146/99 (BP Location: Left Arm)   Pulse 114   Temp 99.6 F (37.6 C) (Oral)   Resp 20   Ht 5\' 5"  (1.651 m)   Wt 88.5 kg   SpO2 97%   BMI 32.45 kg/m   Physical Exam  Constitutional: She appears well-developed and well-nourished. No distress.  HENT:  Head: Normocephalic and atraumatic.  Right Ear: Tympanic membrane and external ear normal.  Left Ear: Tympanic membrane and external ear normal.  Mouth/Throat: Uvula is midline, oropharynx is clear and  moist and mucous membranes are normal. No oropharyngeal exudate, posterior oropharyngeal edema, posterior oropharyngeal erythema or tonsillar abscesses.  Eyes: Conjunctivae are normal. Right eye exhibits no discharge. Left eye exhibits no discharge. No scleral icterus.  Neck: Neck supple. No tracheal deviation present.  Cardiovascular: Normal rate, regular rhythm and intact distal pulses.   Pulmonary/Chest: Effort normal and breath sounds normal. No stridor. No respiratory distress. She has no wheezes. She has no rales.  Frequent coughing  Abdominal: Soft. Bowel sounds are normal. She exhibits no distension. There is no tenderness.  There is no rebound and no guarding.  Musculoskeletal: She exhibits no edema or tenderness.  Neurological: She is alert. She has normal strength. No cranial nerve deficit (no facial droop, extraocular movements intact, no slurred speech) or sensory deficit. She exhibits normal muscle tone. She displays no seizure activity. Coordination normal.  Skin: Skin is warm and dry. No rash noted.  Psychiatric: She has a normal mood and affect.  Nursing note and vitals reviewed.  ED Treatments / Results  DIAGNOSTIC STUDIES: Oxygen Saturation is 100% on RA, normal by my interpretation.    COORDINATION OF CARE: 9:13 PM-Discussed treatment plan which includes chest x-ray with pt at bedside and pt agreed to plan.     Radiology Dg Chest 2 View  Result Date: 10/05/2016 CLINICAL DATA:  Cough and fever for 2 days EXAM: CHEST  2 VIEW COMPARISON:  December 24, 2011 FINDINGS: The heart size and mediastinal contours are within normal limits. Both lungs are clear. The visualized skeletal structures are unremarkable. IMPRESSION: No active cardiopulmonary disease. Electronically Signed   By: Sherian Rein M.D.   On: 10/05/2016 21:45    Procedures Procedures (including critical care time)  Medications Ordered in ED Medications  ipratropium-albuterol (DUONEB) 0.5-2.5 (3) MG/3ML nebulizer solution 3 mL (3 mLs Nebulization Given 10/05/16 2221)  acetaminophen (TYLENOL) tablet 650 mg (650 mg Oral Given 10/05/16 2138)  ibuprofen (ADVIL,MOTRIN) tablet 600 mg (600 mg Oral Given 10/05/16 2138)     Initial Impression / Assessment and Plan / ED Course  I have reviewed the triage vital signs and the nursing notes.  Pertinent labs & imaging results that were available during my care of the patient were reviewed by me and considered in my medical decision making (see chart for details).  Clinical Course    Symptoms are consistent with an upper respiratory infection. There is no evidence to suggest pneumonia on CXR.  The patient does not appear to have an otitis media. I discussed supportive treatment. I encouraged followup with the primary care doctor next week if symptoms have not resolved. Warning signs and reasons to return to the emergency room were discussed    Final Clinical Impressions(s) / ED Diagnoses   Final diagnoses:  Influenza-like illness    New Prescriptions New Prescriptions   NAPROXEN (NAPROSYN) 500 MG TABLET    Take 1 tablet (500 mg total) by mouth 2 (two) times daily with a meal.  I personally performed the services described in this documentation, which was scribed in my presence.  The recorded information has been reviewed and is accurate.     Linwood Dibbles, MD 10/05/16 2234

## 2016-10-05 NOTE — ED Triage Notes (Signed)
C/o body aches, fever, cough started yesterday-NAD-steady gait

## 2016-11-12 ENCOUNTER — Encounter (HOSPITAL_BASED_OUTPATIENT_CLINIC_OR_DEPARTMENT_OTHER): Payer: Self-pay | Admitting: *Deleted

## 2016-11-12 ENCOUNTER — Emergency Department (HOSPITAL_BASED_OUTPATIENT_CLINIC_OR_DEPARTMENT_OTHER)
Admission: EM | Admit: 2016-11-12 | Discharge: 2016-11-12 | Disposition: A | Discharge status: Home / Self Care | Payer: Medicaid Other | Attending: Emergency Medicine | Admitting: Emergency Medicine

## 2016-11-12 DIAGNOSIS — F17210 Nicotine dependence, cigarettes, uncomplicated: Secondary | ICD-10-CM | POA: Insufficient documentation

## 2016-11-12 DIAGNOSIS — T23221A Burn of second degree of single right finger (nail) except thumb, initial encounter: Secondary | ICD-10-CM | POA: Diagnosis not present

## 2016-11-12 DIAGNOSIS — T23021A Burn of unspecified degree of single right finger (nail) except thumb, initial encounter: Secondary | ICD-10-CM | POA: Diagnosis present

## 2016-11-12 DIAGNOSIS — J45909 Unspecified asthma, uncomplicated: Secondary | ICD-10-CM | POA: Diagnosis not present

## 2016-11-12 DIAGNOSIS — Y9389 Activity, other specified: Secondary | ICD-10-CM | POA: Diagnosis not present

## 2016-11-12 DIAGNOSIS — F909 Attention-deficit hyperactivity disorder, unspecified type: Secondary | ICD-10-CM | POA: Diagnosis not present

## 2016-11-12 DIAGNOSIS — Y929 Unspecified place or not applicable: Secondary | ICD-10-CM | POA: Insufficient documentation

## 2016-11-12 DIAGNOSIS — X19XXXA Contact with other heat and hot substances, initial encounter: Secondary | ICD-10-CM | POA: Insufficient documentation

## 2016-11-12 DIAGNOSIS — Z791 Long term (current) use of non-steroidal anti-inflammatories (NSAID): Secondary | ICD-10-CM | POA: Insufficient documentation

## 2016-11-12 DIAGNOSIS — Y999 Unspecified external cause status: Secondary | ICD-10-CM | POA: Insufficient documentation

## 2016-11-12 MED ORDER — CEPHALEXIN 250 MG PO CAPS
500.0000 mg | ORAL_CAPSULE | Freq: Once | ORAL | Status: AC
  Administered 2016-11-12: 500 mg via ORAL
  Filled 2016-11-12: qty 2

## 2016-11-12 MED ORDER — LIDOCAINE VISCOUS 2 % MT SOLN
OROMUCOSAL | Status: AC
  Filled 2016-11-12: qty 15

## 2016-11-12 MED ORDER — CEPHALEXIN 500 MG PO CAPS: 500.0000 mg | ORAL_CAPSULE | Freq: Four times a day (QID) | ORAL | 0 refills | Status: DC

## 2016-11-12 MED ORDER — IBUPROFEN 800 MG PO TABS
ORAL_TABLET | ORAL | Status: AC
  Administered 2016-11-12: 800 mg via ORAL
  Filled 2016-11-12: qty 1

## 2016-11-12 MED ORDER — IBUPROFEN 800 MG PO TABS
800.0000 mg | ORAL_TABLET | Freq: Once | ORAL | Status: AC
  Administered 2016-11-12: 800 mg via ORAL

## 2016-11-12 MED ORDER — OXYCODONE-ACETAMINOPHEN 5-325 MG PO TABS: 1.0000 | ORAL_TABLET | ORAL | 0 refills | Status: DC | PRN

## 2016-11-12 MED ORDER — LIDOCAINE VISCOUS 2 % MT SOLN
15.0000 mL | Freq: Once | OROMUCOSAL | Status: AC
  Administered 2016-11-12: 15 mL via OROMUCOSAL

## 2016-11-12 NOTE — Discharge Instructions (Signed)
Apply ice several times a day. Continue taking ibuprofen and acetaminophen as needed for pain - reserve oxycodone-acetaminophen for severe pain.
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
## 2016-11-12 NOTE — ED Notes (Signed)
Dr. Glick at [facility] 

## 2017-02-25 ENCOUNTER — Emergency Department (HOSPITAL_BASED_OUTPATIENT_CLINIC_OR_DEPARTMENT_OTHER)
Admission: EM | Admit: 2017-02-25 | Discharge: 2017-02-25 | Disposition: A | Discharge status: Home / Self Care | Payer: No Typology Code available for payment source | Attending: Emergency Medicine | Admitting: Emergency Medicine

## 2017-02-25 ENCOUNTER — Encounter (HOSPITAL_BASED_OUTPATIENT_CLINIC_OR_DEPARTMENT_OTHER): Payer: Self-pay

## 2017-02-25 DIAGNOSIS — M25511 Pain in right shoulder: Secondary | ICD-10-CM | POA: Diagnosis not present

## 2017-02-25 DIAGNOSIS — J45909 Unspecified asthma, uncomplicated: Secondary | ICD-10-CM | POA: Diagnosis not present

## 2017-02-25 DIAGNOSIS — M25512 Pain in left shoulder: Secondary | ICD-10-CM | POA: Diagnosis not present

## 2017-02-25 DIAGNOSIS — Y92410 Unspecified street and highway as the place of occurrence of the external cause: Secondary | ICD-10-CM | POA: Insufficient documentation

## 2017-02-25 DIAGNOSIS — S4992XA Unspecified injury of left shoulder and upper arm, initial encounter: Secondary | ICD-10-CM | POA: Diagnosis present

## 2017-02-25 DIAGNOSIS — F17210 Nicotine dependence, cigarettes, uncomplicated: Secondary | ICD-10-CM | POA: Insufficient documentation

## 2017-02-25 DIAGNOSIS — Y999 Unspecified external cause status: Secondary | ICD-10-CM | POA: Insufficient documentation

## 2017-02-25 DIAGNOSIS — Y939 Activity, unspecified: Secondary | ICD-10-CM | POA: Insufficient documentation

## 2017-02-25 DIAGNOSIS — F909 Attention-deficit hyperactivity disorder, unspecified type: Secondary | ICD-10-CM | POA: Diagnosis not present

## 2017-02-25 MED ORDER — LIDOCAINE 5 % EX PTCH: 1.0000 | MEDICATED_PATCH | CUTANEOUS | 0 refills | Status: DC

## 2017-02-25 MED ORDER — METHOCARBAMOL 500 MG PO TABS: 500.0000 mg | ORAL_TABLET | Freq: Two times a day (BID) | ORAL | 0 refills | Status: DC

## 2017-02-25 MED ORDER — IBUPROFEN 600 MG PO TABS: 600.0000 mg | ORAL_TABLET | Freq: Four times a day (QID) | ORAL | 0 refills | Status: DC | PRN
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
## 2017-02-25 NOTE — Discharge Instructions (Signed)
Expect your soreness to increase over the next 2-3 days. Take it easy, but do not lay around too much as this may make any stiffness worse.  °Antiinflammatory medications: Take 500 mg of naproxen every 12 hours or 800 mg of ibuprofen every 8 hours for the next 3 days. Take these medications with food to avoid upset stomach. Choose only one of these medications, do not take them together. ° °Muscle relaxer: Robaxin is a muscle relaxer and may help loosen stiff muscles. Do not take the Robaxin while driving or performing other dangerous activities.  ° °Lidocaine patches: These are available via either prescription or over-the-counter. The over-the-counter option may be more economical one and are likely just as effective. There are multiple over-the-counter brands, such as Salonpas. ° °Exercises: Be sure to perform the attached exercises starting with three times a week and working up to performing them daily. This is an essential part of preventing long term problems.  ° °Follow up with a primary care provider for any future management of these complaints. °

## 2017-08-27 ENCOUNTER — Encounter (HOSPITAL_BASED_OUTPATIENT_CLINIC_OR_DEPARTMENT_OTHER): Payer: Self-pay | Admitting: Emergency Medicine

## 2017-08-27 ENCOUNTER — Emergency Department (HOSPITAL_BASED_OUTPATIENT_CLINIC_OR_DEPARTMENT_OTHER)
Admission: EM | Admit: 2017-08-27 | Discharge: 2017-08-27 | Disposition: A | Discharge status: Home / Self Care | Payer: Medicaid Other | Attending: Emergency Medicine | Admitting: Emergency Medicine

## 2017-08-27 DIAGNOSIS — N730 Acute parametritis and pelvic cellulitis: Secondary | ICD-10-CM | POA: Insufficient documentation

## 2017-08-27 DIAGNOSIS — Z79899 Other long term (current) drug therapy: Secondary | ICD-10-CM | POA: Insufficient documentation

## 2017-08-27 DIAGNOSIS — J45909 Unspecified asthma, uncomplicated: Secondary | ICD-10-CM | POA: Insufficient documentation

## 2017-08-27 DIAGNOSIS — F17210 Nicotine dependence, cigarettes, uncomplicated: Secondary | ICD-10-CM | POA: Insufficient documentation

## 2017-08-27 DIAGNOSIS — R1030 Lower abdominal pain, unspecified: Secondary | ICD-10-CM | POA: Diagnosis present

## 2017-08-27 LAB — WET PREP, GENITAL
SPERM: NONE SEEN
Trich, Wet Prep: NONE SEEN
YEAST WET PREP: NONE SEEN

## 2017-08-27 LAB — URINALYSIS, ROUTINE W REFLEX MICROSCOPIC
BILIRUBIN URINE: NEGATIVE
Glucose, UA: NEGATIVE mg/dL
Ketones, ur: NEGATIVE mg/dL
NITRITE: NEGATIVE
PH: 6 (ref 5.0–8.0)
Protein, ur: NEGATIVE mg/dL
SPECIFIC GRAVITY, URINE: 1.02 (ref 1.005–1.030)

## 2017-08-27 LAB — URINALYSIS, MICROSCOPIC (REFLEX)

## 2017-08-27 LAB — PREGNANCY, URINE: Preg Test, Ur: NEGATIVE

## 2017-08-27 MED ORDER — NAPROXEN 500 MG PO TABS: 500.0000 mg | ORAL_TABLET | Freq: Two times a day (BID) | ORAL | 0 refills | Status: DC

## 2017-08-27 MED ORDER — METRONIDAZOLE 500 MG PO TABS: 500.0000 mg | ORAL_TABLET | Freq: Two times a day (BID) | ORAL | 0 refills | Status: DC

## 2017-08-27 MED ORDER — AZITHROMYCIN 250 MG PO TABS
1000.0000 mg | ORAL_TABLET | Freq: Once | ORAL | Status: AC
  Administered 2017-08-27: 1000 mg via ORAL
  Filled 2017-08-27: qty 4

## 2017-08-27 MED ORDER — CEFTRIAXONE SODIUM 250 MG IJ SOLR
250.0000 mg | Freq: Once | INTRAMUSCULAR | Status: AC
  Administered 2017-08-27: 250 mg via INTRAMUSCULAR
  Filled 2017-08-27: qty 250

## 2017-08-27 MED ORDER — DOXYCYCLINE HYCLATE 100 MG PO CAPS
100.0000 mg | ORAL_CAPSULE | Freq: Two times a day (BID) | ORAL | 0 refills | Status: DC
Start: 2017-08-27 — End: 2018-04-11

## 2017-08-27 MED ORDER — IBUPROFEN 400 MG PO TABS
600.0000 mg | ORAL_TABLET | Freq: Once | ORAL | Status: AC
  Administered 2017-08-27: 600 mg via ORAL
  Filled 2017-08-27: qty 1

## 2017-08-27 MED FILL — DOXYCYCLINE HYCLATE 100 MG: 100 | 14 days supply | Qty: 28 | Fill #0

## 2017-08-27 MED FILL — metroNIDAZOLE 500 MG TABS: 500 | 7 days supply | Qty: 14 | Fill #0

## 2017-08-27 MED FILL — NAPROXEN 500 MG TABLET: 500 | 7 days supply | Qty: 14 | Fill #0

## 2017-08-27 NOTE — ED Triage Notes (Signed)
Patient reports lower abdominal pain which began last night.  Reports nausea.  Denies diarrhea.  Reports that she had last normal period last week but then began having vaginal bleeding again last night.

## 2017-08-27 NOTE — ED Provider Notes (Addendum)
[facility] Provider Note   CSN: [id number] Arrival date & time: 08/27/17  0913     History   Chief Complaint Chief Complaint  Patient presents with  . Abdominal Pain    HPI Deborah Walters is a 26 y.o. female.  HPI Pt comes in with cc of abd pain. Pt has hx of PID and she reports that her pain is in the lower part of her abd, worst on the R side. Pt's pain started last night, and it is constant, pressure like pain. Pt has no n/v/f/c. Pt does indicate that she has some pressure with urination and bleeding. Pt's LMP was 2 weeks ago.  Past Medical History:  Diagnosis Date  . ADHD (attention deficit hyperactivity disorder)   . Asthma     Patient Active Problem List   Diagnosis Date Noted  . PID (acute pelvic inflammatory disease) 05/27/2014    Past Surgical History:  Procedure Laterality Date  . ANKLE SURGERY      OB History    Gravida Para Term Preterm AB Living   0 0           SAB TAB Ectopic Multiple Live Births                   Home Medications    Prior to Admission medications   Medication Sig Start Date End Date Taking? Authorizing Provider  cephALEXin (KEFLEX) 500 MG capsule Take 1 capsule (500 mg total) by mouth 4 (four) times daily. 11/12/16   Dione Booze, MD  doxycycline (VIBRAMYCIN) 100 MG capsule Take 1 capsule (100 mg total) by mouth 2 (two) times daily. 08/27/17   Derwood Kaplan, MD  ibuprofen (ADVIL,MOTRIN) 600 MG tablet Take 1 tablet (600 mg total) by mouth every 6 (six) hours as needed. 02/25/17   Joy, Shawn C, PA-C  lidocaine (LIDODERM) 5 % Place 1 patch onto the skin daily. Remove & Discard patch within 12 hours or as directed by MD 02/25/17   Joy, Shawn C, PA-C  methocarbamol (ROBAXIN) 500 MG tablet Take 1 tablet (500 mg total) by mouth 2 (two) times daily. 02/25/17   Joy, Shawn C, PA-C  metroNIDAZOLE (FLAGYL) 500 MG tablet Take 1 tablet (500 mg total) by mouth 2 (two) times daily. 08/27/17   Derwood Kaplan, MD  naproxen (NAPROSYN) 500  MG tablet Take 1 tablet (500 mg total) by mouth 2 (two) times daily. 08/27/17   Derwood Kaplan, MD  oxyCODONE-acetaminophen (PERCOCET) 5-325 MG tablet Take 1 tablet by mouth every 4 (four) hours as needed for moderate pain. 11/12/16   Dione Booze, MD    Family History History reviewed. No pertinent family history.  Social History Social History  Substance Use Topics  . Smoking status: Current Some Day Smoker    Packs/day: 0.50    Types: Cigarettes  . Smokeless tobacco: Never Used  . Alcohol use Yes     Comment: occ     Allergies   Patient has no known allergies.   Review of Systems Review of Systems  Constitutional: Positive for activity change.  Gastrointestinal: Positive for abdominal pain.  All other systems reviewed and are negative.    Physical Exam Updated Vital Signs BP (!) 141/82 (BP Location: Right Arm)   Pulse 92   Temp 99.9 F (37.7 C) (Oral) Comment: Patient smoking cigarette just prior to reading  Resp 18   Ht  (1.651 m)   Wt 88.5 kg (195 lb)   LMP 08/15/2017 (Approximate)  SpO2 98%   BMI 32.45 kg/m   Physical Exam  Constitutional: She is oriented to person, place, and time. She appears well-developed and well-nourished.  HENT:  Head: Normocephalic and atraumatic.  Eyes: Pupils are equal, round, and reactive to light. Conjunctivae and EOM are normal.  Neck: Normal range of motion. Neck supple.  Cardiovascular: Normal rate, regular rhythm, normal heart sounds and intact distal pulses.   No murmur heard. Pulmonary/Chest: Effort normal. No respiratory distress. She has no wheezes.  Abdominal: Soft. Bowel sounds are normal. She exhibits no distension. There is tenderness. There is guarding. There is no rebound.  RLQ tenderness  Genitourinary: Vagina normal and uterus normal.  Genitourinary Comments: External exam - normal, no lesions Speculum exam: Pt has some yellow discharge, and blood. Bimanual exam: Patient has CMT, no adnexal tenderness  or fullness and cervical os is closed  Neurological: She is alert and oriented to person, place, and time.  Skin: Skin is warm and dry.  Nursing note and vitals reviewed.     ED Treatments / Results  Labs (all labs ordered are listed, but only abnormal results are displayed) Labs Reviewed  WET PREP, GENITAL - Abnormal; Notable for the following:       Result Value   Clue Cells Wet Prep HPF POC PRESENT (*)    WBC, Wet Prep HPF POC MANY (*)    All other components within normal limits  URINALYSIS, ROUTINE W REFLEX MICROSCOPIC - Abnormal; Notable for the following:    Hgb urine dipstick LARGE (*)    Leukocytes, UA MODERATE (*)    All other components within normal limits  URINALYSIS, MICROSCOPIC (REFLEX) - Abnormal; Notable for the following:    Bacteria, UA MANY (*)    Squamous Epithelial / LPF 0-5 (*)    All other components within normal limits  PREGNANCY, URINE  GC/CHLAMYDIA PROBE AMP ([facility]) NOT AT [facility]    EKG  EKG Interpretation None       Radiology No results found.  Procedures Procedures (including critical care time)  Medications Ordered in ED Medications  azithromycin (ZITHROMAX) tablet 1,000 mg (not administered)  cefTRIAXone (ROCEPHIN) injection 250 mg (not administered)     Initial Impression / Assessment and Plan / ED Course  I have reviewed the triage vital signs and the nursing notes.  Pertinent labs & imaging results that were available during my care of the patient were reviewed by me and considered in my medical decision making (see chart for details).     Pt comes in with  CC of abd pain that started yday. Pain is in the lower quadrants. Pt has some vaginal bleeding associated with it, and has hx of PID 3 years back that had similar presentation, and a neg CT. Plan is to start with pelvic exam and then reassess.  10:29 AM Pelvic exam revealed cervical motion tenderness. We will treat as PID. Strict return precautions discussed for  appendicitis. Pt is in agreement with the plan. Pt will be given work note for 2 days as well.   Final Clinical Impressions(s) / ED Diagnoses   Final diagnoses:  Acute pelvic inflammatory disease (PID)    New Prescriptions New Prescriptions   DOXYCYCLINE (VIBRAMYCIN) 100 MG CAPSULE    Take 1 capsule (100 mg total) by mouth 2 (two) times daily.   METRONIDAZOLE (FLAGYL) 500 MG TABLET    Take 1 tablet (500 mg total) by mouth 2 (two) times daily.   NAPROXEN (NAPROSYN) 500 MG TABLET  Take 1 tablet (500 mg total) by mouth 2 (two) times daily.     Derwood Kaplan, MD 08/27/17 1001    Derwood Kaplan, MD 08/27/17 1030

## 2017-08-27 NOTE — Discharge Instructions (Signed)
We saw you in the ER for the lower abdominal pain. Based on the exam, we suspect that you have a pelvic infection. We treated you for gonorrhea and chlamydia infection while you were in the ER Rest of the results show: There is no evidence of Urinary tract infection and the pregnancy test is negative as well.  PLEASE PRACTICE SAFE SEX. PLEASE INFORM YOUR PARTNER TO GET CHECKED AND TREATED FOR STD AS WELL.  Return to the ER if the abdominal pan is getting worse, you start having fevers and inability to keep medications down.

## 2017-08-28 LAB — GC/CHLAMYDIA PROBE AMP (~~LOC~~) NOT AT ARMC
Chlamydia: POSITIVE — AB
Neisseria Gonorrhea: POSITIVE — AB

## 2018-02-21 ENCOUNTER — Emergency Department (HOSPITAL_BASED_OUTPATIENT_CLINIC_OR_DEPARTMENT_OTHER): Payer: Medicaid Other

## 2018-02-21 ENCOUNTER — Encounter (HOSPITAL_BASED_OUTPATIENT_CLINIC_OR_DEPARTMENT_OTHER): Payer: Self-pay | Admitting: Emergency Medicine

## 2018-02-21 ENCOUNTER — Emergency Department (HOSPITAL_BASED_OUTPATIENT_CLINIC_OR_DEPARTMENT_OTHER)
Admission: EM | Admit: 2018-02-21 | Discharge: 2018-02-21 | Disposition: A | Discharge status: Home / Self Care | Payer: Medicaid Other | Attending: Emergency Medicine | Admitting: Emergency Medicine

## 2018-02-21 ENCOUNTER — Other Ambulatory Visit: Payer: Self-pay

## 2018-02-21 DIAGNOSIS — J111 Influenza due to unidentified influenza virus with other respiratory manifestations: Secondary | ICD-10-CM | POA: Diagnosis not present

## 2018-02-21 DIAGNOSIS — Z79899 Other long term (current) drug therapy: Secondary | ICD-10-CM | POA: Diagnosis not present

## 2018-02-21 DIAGNOSIS — M791 Myalgia, unspecified site: Secondary | ICD-10-CM | POA: Diagnosis present

## 2018-02-21 DIAGNOSIS — F17210 Nicotine dependence, cigarettes, uncomplicated: Secondary | ICD-10-CM | POA: Insufficient documentation

## 2018-02-21 DIAGNOSIS — R69 Illness, unspecified: Secondary | ICD-10-CM

## 2018-02-21 DIAGNOSIS — J45909 Unspecified asthma, uncomplicated: Secondary | ICD-10-CM | POA: Diagnosis not present

## 2018-02-21 LAB — PREGNANCY, URINE: PREG TEST UR: NEGATIVE

## 2018-02-21 MED ORDER — NAPROXEN 500 MG PO TABS: 500.0000 mg | ORAL_TABLET | Freq: Two times a day (BID) | ORAL | 0 refills | Status: DC

## 2018-02-21 MED ORDER — KETOROLAC TROMETHAMINE 30 MG/ML IJ SOLN: 15.0000 mg | Freq: Once | INTRAMUSCULAR | Status: DC

## 2018-02-21 MED ORDER — KETOROLAC TROMETHAMINE 15 MG/ML IJ SOLN
15.0000 mg | Freq: Once | INTRAMUSCULAR | Status: AC
  Administered 2018-02-21: 15 mg via INTRAVENOUS
  Filled 2018-02-21: qty 1

## 2018-02-21 MED ORDER — BENZONATATE 100 MG PO CAPS: 200.0000 mg | ORAL_CAPSULE | Freq: Three times a day (TID) | ORAL | 0 refills | Status: DC

## 2018-02-21 MED ORDER — ACETAMINOPHEN 325 MG PO TABS
650.0000 mg | ORAL_TABLET | Freq: Once | ORAL | Status: AC | PRN
  Administered 2018-02-21: 650 mg via ORAL
  Filled 2018-02-21: qty 2

## 2018-02-21 MED ORDER — ONDANSETRON 4 MG PO TBDP: 4.0000 mg | ORAL_TABLET | Freq: Three times a day (TID) | ORAL | 0 refills | Status: DC | PRN

## 2018-02-21 MED ORDER — FLUTICASONE PROPIONATE 50 MCG/ACT NA SUSP: 1.0000 | Freq: Every day | NASAL | 2 refills | Status: DC

## 2018-02-21 MED ORDER — OSELTAMIVIR PHOSPHATE 75 MG PO CAPS
75.0000 mg | ORAL_CAPSULE | Freq: Once | ORAL | Status: AC
  Administered 2018-02-21: 75 mg via ORAL
  Filled 2018-02-21: qty 1

## 2018-02-21 MED ORDER — ONDANSETRON HCL 4 MG/2ML IJ SOLN
4.0000 mg | Freq: Once | INTRAMUSCULAR | Status: AC
  Administered 2018-02-21: 4 mg via INTRAVENOUS
  Filled 2018-02-21: qty 2

## 2018-02-21 MED ORDER — OSELTAMIVIR PHOSPHATE 75 MG PO CAPS: 75.0000 mg | ORAL_CAPSULE | Freq: Two times a day (BID) | ORAL | 0 refills | Status: DC

## 2018-02-21 MED FILL — TAMIFLU 75 MG GELCAP: 75 | 4 days supply | Qty: 9 | Fill #0

## 2018-02-21 MED FILL — FLUTICASONE PROP 50 MCG SPR: 50 | 60 days supply | Qty: 16 | Fill #0

## 2018-02-21 MED FILL — ONDANSETRON ODT 4 MG TABLET: 4 | 1 days supply | Qty: 2 | Fill #0

## 2018-02-21 MED FILL — NAPROXEN 500 MG TABLET: 500 | 15 days supply | Qty: 30 | Fill #0

## 2018-02-21 NOTE — ED Triage Notes (Signed)
Headache, body aches, cough since yesterday.

## 2018-02-21 NOTE — ED Provider Notes (Signed)
[facility] EMERGENCY DEPARTMENT Provider Note   CSN: [id number] Arrival date & time: 02/21/18  1217     History   Chief Complaint Chief Complaint  Patient presents with  . Flu like symptoms    HPI Deborah Walters is a 27 y.o. female with a past medical history of asthma, who presents to ED for evaluation of acute onset realized body aches, cough, rhinorrhea, nausea, that began yesterday.  Sick contacts including daughter with similar symptoms.  She has been using TheraFlu with no improvement in her symptoms.  She did not receive her influenza vaccine this year.  Denies any wheezing, chest pain, shortness of breath, vomiting, neck pain.  HPI  Past Medical History:  Diagnosis Date  . ADHD (attention deficit hyperactivity disorder)   . Asthma     Patient Active Problem List   Diagnosis Date Noted  . PID (acute pelvic inflammatory disease) 05/27/2014    Past Surgical History:  Procedure Laterality Date  . ANKLE SURGERY      OB History    Gravida Para Term Preterm AB Living   0 0           SAB TAB Ectopic Multiple Live Births                   Home Medications    Prior to Admission medications   Medication Sig Start Date End Date Taking? Authorizing Provider  benzonatate (TESSALON) 100 MG capsule Take 2 capsules (200 mg total) by mouth every 8 (eight) hours. 02/21/18   Amsi Grimley, PA-C  cephALEXin (KEFLEX) 500 MG capsule Take 1 capsule (500 mg total) by mouth 4 (four) times daily. 11/12/16   Dione BoozeGlick, David, MD  doxycycline (VIBRAMYCIN) 100 MG capsule Take 1 capsule (100 mg total) by mouth 2 (two) times daily. 08/27/17   Derwood KaplanNanavati, Ankit, MD  fluticasone (FLONASE) 50 MCG/ACT nasal spray Place 1 spray into both nostrils daily. 02/21/18   Arjen Deringer, PA-C  ibuprofen (ADVIL,MOTRIN) 600 MG tablet Take 1 tablet (600 mg total) by mouth every 6 (six) hours as needed. 02/25/17   Joy, Shawn C, PA-C  lidocaine (LIDODERM) 5 % Place 1 patch onto the skin daily. Remove & Discard  patch within 12 hours or as directed by MD 02/25/17   Joy, Shawn C, PA-C  methocarbamol (ROBAXIN) 500 MG tablet Take 1 tablet (500 mg total) by mouth 2 (two) times daily. 02/25/17   Joy, Shawn C, PA-C  metroNIDAZOLE (FLAGYL) 500 MG tablet Take 1 tablet (500 mg total) by mouth 2 (two) times daily. 08/27/17   Derwood KaplanNanavati, Ankit, MD  naproxen (NAPROSYN) 500 MG tablet Take 1 tablet (500 mg total) by mouth 2 (two) times daily. 02/21/18   Navjot Loera, PA-C  ondansetron (ZOFRAN ODT) 4 MG disintegrating tablet Take 1 tablet (4 mg total) by mouth every 8 (eight) hours as needed for nausea or vomiting. 02/21/18   Lorra Freeman, PA-C  oseltamivir (TAMIFLU) 75 MG capsule Take 1 capsule (75 mg total) by mouth every 12 (twelve) hours. 02/21/18   Olga Bourbeau, PA-C  oxyCODONE-acetaminophen (PERCOCET) 5-325 MG tablet Take 1 tablet by mouth every 4 (four) hours as needed for moderate pain. 11/12/16   Dione BoozeGlick, David, MD    Family History No family history on file.  Social History Social History   Tobacco Use  . Smoking status: Current Some Day Smoker    Packs/day: 0.50    Types: Cigarettes  . Smokeless tobacco: Never Used  Substance Use Topics  .  Alcohol use: Yes    Comment: occ  . Drug use: No     Allergies   Patient has no known allergies.   Review of Systems Review of Systems  Constitutional: Positive for fever. Negative for appetite change and chills.  HENT: Positive for congestion, rhinorrhea and sneezing. Negative for ear pain and sore throat.   Eyes: Negative for photophobia and visual disturbance.  Respiratory: Positive for cough. Negative for chest tightness, shortness of breath and wheezing.   Cardiovascular: Negative for chest pain and palpitations.  Gastrointestinal: Positive for nausea. Negative for abdominal pain, blood in stool, constipation, diarrhea and vomiting.  Genitourinary: Negative for dysuria, hematuria and urgency.  Musculoskeletal: Positive for myalgias. Negative for neck pain.    Skin: Negative for rash.  Neurological: Negative for dizziness, weakness and light-headedness.     Physical Exam Updated Vital Signs BP (!) 104/59 (BP Location: Left Arm)   Pulse 90   Temp 99.3 F (37.4 C) (Oral)   Resp 20   Ht 5\' 5"  (1.651 m)   Wt 99.8 kg (220 lb)   LMP 01/28/2018   SpO2 97%   BMI 36.61 kg/m   Physical Exam  Constitutional: She appears well-developed and well-nourished. No distress.  Nontoxic appearing and in no acute distress.  Speaking complete sentences without difficulty.  HENT:  Head: Normocephalic and atraumatic.  Right Ear: A middle ear effusion is present.  Left Ear: A middle ear effusion is present.  Nose: Rhinorrhea present.  Mouth/Throat: Uvula is midline. No trismus in the jaw. No uvula swelling. Posterior oropharyngeal erythema present. No posterior oropharyngeal edema. No tonsillar exudate.  Eyes: Conjunctivae and EOM are normal. Left eye exhibits no discharge. No scleral icterus.  Neck: Normal range of motion. Neck supple.  Cardiovascular: Normal rate, regular rhythm, normal heart sounds and intact distal pulses. Exam reveals no gallop and no friction rub.  No murmur heard. Pulmonary/Chest: Effort normal and breath sounds normal. No respiratory distress.  No wheezing or coarse breath sounds noted.  Abdominal: Soft. Bowel sounds are normal. She exhibits no distension. There is no tenderness. There is no guarding.  Musculoskeletal: Normal range of motion. She exhibits no edema.  Neurological: She is alert. She exhibits normal muscle tone. Coordination normal.  Skin: Skin is warm and dry. No rash noted.  Psychiatric: She has a normal mood and affect.  Nursing note and vitals reviewed.    ED Treatments / Results  Labs (all labs ordered are listed, but only abnormal results are displayed) Labs Reviewed  PREGNANCY, URINE    EKG  EKG Interpretation None       Radiology Dg Chest 2 View  Result Date: 02/21/2018 CLINICAL DATA:   Fever, cough, shortness of breath. EXAM: CHEST - 2 VIEW COMPARISON:  Radiographs of October 05, 2016. FINDINGS: The heart size and mediastinal contours are within normal limits. Both lungs are clear. No pneumothorax or pleural effusion is noted. The visualized skeletal structures are unremarkable. IMPRESSION: No active cardiopulmonary disease. Electronically Signed   By: Lupita Raider, M.D.   On: 02/21/2018 12:53    Procedures Procedures (including critical care time)  Medications Ordered in ED Medications  oseltamivir (TAMIFLU) capsule 75 mg (not administered)  ondansetron (ZOFRAN) injection 4 mg (not administered)  ketorolac (TORADOL) 15 MG/ML injection 15 mg (not administered)  acetaminophen (TYLENOL) tablet 650 mg (650 mg Oral Given 02/21/18 1240)     Initial Impression / Assessment and Plan / ED Course  I have reviewed the triage vital  signs and the nursing notes.  Pertinent labs & imaging results that were available during my care of the patient were reviewed by me and considered in my medical decision making (see chart for details).     Patient presents to ED for evaluation of influenza-like illness including generalized body aches, cough, rhinorrhea, nausea since yesterday.  She does have a history of asthma.  On physical exam there is evidence of rhinorrhea.  He was initially febrile to 102.7 and mildly tachycardic.  She does not appear septic or in any acute distress.  Fever and heart rate improved with supportive measures including antipyretics.  She is tolerating secretions with no trismus, drooling, signs of wheezing or respiratory distress.  Chest x-ray returned as negative.  Urine pregnancy negative.  Since she is still in the window of administration for Tamiflu, patient elects to continue with treatment.  Her first dose here, Toradol and Zofran.  Doubt pulmonary cause of symptoms. Will discharge home with Tamiflu, antitussives, antiemetics, anti-inflammatories and Flonase.   Patient appears stable for discharge at this time.  Strict return precautions given.  Portions of this note were generated with Scientist, clinical (histocompatibility and immunogenetics). Dictation errors may occur despite best attempts at proofreading.  Final Clinical Impressions(s) / ED Diagnoses   Final diagnoses:  Influenza-like illness    ED Discharge Orders        Ordered    oseltamivir (TAMIFLU) 75 MG capsule  Every 12 hours     02/21/18 1531    ondansetron (ZOFRAN ODT) 4 MG disintegrating tablet  Every 8 hours PRN     02/21/18 1531    benzonatate (TESSALON) 100 MG capsule  Every 8 hours     02/21/18 1531    naproxen (NAPROSYN) 500 MG tablet  2 times daily     02/21/18 1531    fluticasone (FLONASE) 50 MCG/ACT nasal spray  Daily     02/21/18 1531       Dietrich Pates, PA-C 02/21/18 1535    Tegeler, Canary Brim, MD 02/21/18 1644

## 2018-02-21 NOTE — Discharge Instructions (Signed)
Please read attach information regarding your condition. Begin taking Tamiflu as directed to help with your flulike symptoms. Take Tessalon Perles as needed for cough.  Take Zofran as needed for nausea.  Use Flonase as needed for nasal congestion. Take naproxen as needed for body aches.  You can take Tylenol as needed for fever. Follow-up with the wellness center listed below for further evaluation. Return to ED for worsening symptoms, trouble breathing or trouble swallowing, lightheadedness or loss of consciousness.

## 2018-04-11 ENCOUNTER — Emergency Department (HOSPITAL_BASED_OUTPATIENT_CLINIC_OR_DEPARTMENT_OTHER)
Admission: EM | Admit: 2018-04-11 | Discharge: 2018-04-11 | Disposition: A | Discharge status: Home / Self Care | Payer: Medicaid Other | Attending: Emergency Medicine | Admitting: Emergency Medicine

## 2018-04-11 ENCOUNTER — Other Ambulatory Visit: Payer: Self-pay

## 2018-04-11 ENCOUNTER — Encounter (HOSPITAL_BASED_OUTPATIENT_CLINIC_OR_DEPARTMENT_OTHER): Payer: Self-pay | Admitting: *Deleted

## 2018-04-11 DIAGNOSIS — R0981 Nasal congestion: Secondary | ICD-10-CM | POA: Diagnosis not present

## 2018-04-11 DIAGNOSIS — J45901 Unspecified asthma with (acute) exacerbation: Secondary | ICD-10-CM | POA: Diagnosis not present

## 2018-04-11 DIAGNOSIS — R0602 Shortness of breath: Secondary | ICD-10-CM | POA: Diagnosis present

## 2018-04-11 DIAGNOSIS — F17210 Nicotine dependence, cigarettes, uncomplicated: Secondary | ICD-10-CM | POA: Diagnosis not present

## 2018-04-11 MED ORDER — AEROCHAMBER PLUS FLO-VU SMALL MISC
1.0000 | Freq: Once | Status: AC
  Administered 2018-04-11: 1
  Filled 2018-04-11: qty 1

## 2018-04-11 MED ORDER — PREDNISONE 20 MG PO TABS: 40.0000 mg | ORAL_TABLET | Freq: Every day | ORAL | 0 refills | Status: DC

## 2018-04-11 MED ORDER — ALBUTEROL SULFATE HFA 108 (90 BASE) MCG/ACT IN AERS
2.0000 | INHALATION_SPRAY | RESPIRATORY_TRACT | Status: DC | PRN
  Administered 2018-04-11: 2 via RESPIRATORY_TRACT
  Filled 2018-04-11: qty 6.7

## 2018-04-11 MED ORDER — IPRATROPIUM-ALBUTEROL 0.5-2.5 (3) MG/3ML IN SOLN
3.0000 mL | Freq: Once | RESPIRATORY_TRACT | Status: AC
  Administered 2018-04-11: 3 mL via RESPIRATORY_TRACT
  Filled 2018-04-11: qty 3

## 2018-04-11 MED ORDER — ALBUTEROL SULFATE (2.5 MG/3ML) 0.083% IN NEBU
2.5000 mg | INHALATION_SOLUTION | Freq: Once | RESPIRATORY_TRACT | Status: AC
  Administered 2018-04-11: 2.5 mg via RESPIRATORY_TRACT
  Filled 2018-04-11: qty 3

## 2018-04-11 NOTE — Discharge Instructions (Signed)
Please read and follow all provided instructions.  Your diagnoses today include:  1. Exacerbation of asthma, unspecified asthma severity, unspecified whether persistent   2. Nasal congestion    Tests performed today include:  Vital signs. See below for your results today.   Medications prescribed:   Prednisone - steroid medicine   It is best to take this medication in the morning to prevent sleeping problems. If you are diabetic, monitor your blood sugar closely and stop taking Prednisone if blood sugar is over 300. Take with food to prevent stomach upset.    Albuterol inhaler - medication that opens up your airway  Use inhaler as follows: 1-2 puffs with spacer every 4 hours as needed for wheezing, cough, or shortness of breath.   Take any prescribed medications only as directed.  Home care instructions:  Follow any educational materials contained in this packet.  Follow-up instructions: Please follow-up with your primary care provider in the next 3 days for further evaluation of your symptoms and management of your asthma.  Return instructions:   Please return to the Emergency Department if you experience worsening symptoms.  Please return with worsening wheezing, shortness of breath, or difficulty breathing.  Return with persistent fever above 101F.   Please return if you have any other emergent concerns.  Additional Information:  Your vital signs today were: BP 133/87    Pulse 94    Temp 98.3 F (36.8 C) (Oral)    Resp 18    Ht  (1.651 m)    Wt 90.7 kg (200 lb)    LMP 03/20/2018    SpO2 99%    BMI 33.28 kg/m  If your blood pressure (BP) was elevated above 135/85 this visit, please have this repeated by your doctor within one month. --------------

## 2018-04-11 NOTE — ED Triage Notes (Signed)
She woke this am with cold symptoms. She went to work and felt worse with SOB. She ran out of her inhaler in November.

## 2018-04-11 NOTE — ED Provider Notes (Addendum)
[facility] EMERGENCY DEPARTMENT Provider Note   CSN: [id number] Arrival date & time: 04/11/18  1826     History   Chief Complaint Chief Complaint  Patient presents with  . Shortness of Breath    HPI Deborah Walters is a 27 y.o. female.  H/o asthma presents with c/o nasal congestion and cough x 1 day, with worsening wheezing today.  She does not currently have an inhaler to use to help her breathe. Works in a [employer]. No fever.  She has needed steroids in the past when she has URI exacerbated asthma.  No nausea, vomiting, diarrhea.  Cough is nonproductive.  This episode is similar to previous episodes. The onset of this condition was acute. The course is constant. Aggravating factors: none. Alleviating factors: none.       Past Medical History:  Diagnosis Date  . ADHD (attention deficit hyperactivity disorder)   . Asthma     Patient Active Problem List   Diagnosis Date Noted  . PID (acute pelvic inflammatory disease) 05/27/2014    Past Surgical History:  Procedure Laterality Date  . ANKLE SURGERY       OB History    Gravida  0   Para  0   Term      Preterm      AB      Living        SAB      TAB      Ectopic      Multiple      Live Births               Home Medications    Prior to Admission medications   Medication Sig Start Date End Date Taking? Authorizing Provider  predniSONE (DELTASONE) 20 MG tablet Take 2 tablets (40 mg total) by mouth daily. 04/11/18   Renne Crigler, PA-C    Family History No family history on file.  Social History Social History   Tobacco Use  . Smoking status: Current Some Day Smoker    Packs/day: 0.50    Types: Cigarettes  . Smokeless tobacco: Never Used  Substance Use Topics  . Alcohol use: Yes    Comment: occ  . Drug use: No     Allergies   Patient has no known allergies.   Review of Systems Review of Systems  Constitutional: Negative for fever.  HENT: Positive for  congestion. Negative for rhinorrhea and sore throat.   Eyes: Negative for redness.  Respiratory: Positive for cough, shortness of breath and wheezing.   Cardiovascular: Negative for chest pain.  Gastrointestinal: Negative for abdominal pain, diarrhea, nausea and vomiting.  Genitourinary: Negative for dysuria.  Musculoskeletal: Negative for myalgias.  Skin: Negative for rash.  Neurological: Negative for headaches.     Physical Exam Updated Vital Signs BP 133/87   Pulse 94   Temp 98.3 F (36.8 C) (Oral)   Resp 18   Ht  (1.651 m)   Wt 90.7 kg (200 lb)   LMP 03/20/2018   SpO2 99%   BMI 33.28 kg/m   Physical Exam  Constitutional: She appears well-developed and well-nourished.  HENT:  Head: Normocephalic and atraumatic.  Right Ear: Tympanic membrane, external ear and ear canal normal.  Left Ear: Tympanic membrane, external ear and ear canal normal.  Nose: Mucosal edema present. No rhinorrhea.  Mouth/Throat: Uvula is midline and oropharynx is clear and moist. No oropharyngeal exudate, posterior oropharyngeal edema or posterior oropharyngeal erythema.  Eyes: Conjunctivae are normal.  Right eye exhibits no discharge. Left eye exhibits no discharge.  Neck: Normal range of motion. Neck supple.  Cardiovascular: Normal rate, regular rhythm and normal heart sounds.  Pulmonary/Chest: Effort normal and breath sounds normal. She has no wheezes (I examined patient after breathing treatment). She has no rhonchi. She has no rales.  Abdominal: Soft. There is no tenderness.  Neurological: She is alert.  Skin: Skin is warm and dry.  Psychiatric: She has a normal mood and affect.  Nursing note and vitals reviewed.    ED Treatments / Results  Labs (all labs ordered are listed, but only abnormal results are displayed) Labs Reviewed - No data to display  EKG None  Radiology No results found.  Procedures Procedures (including critical care time)  Medications Ordered in  ED Medications  ipratropium-albuterol (DUONEB) 0.5-2.5 (3) MG/3ML nebulizer solution 3 mL (3 mLs Nebulization Given 04/11/18 1958)  albuterol (PROVENTIL) (2.5 MG/3ML) 0.083% nebulizer solution 2.5 mg (2.5 mg Nebulization Given 04/11/18 1958)  AEROCHAMBER PLUS FLO-VU SMALL device MISC 1 each (1 each Other Given 04/11/18 2038)     Initial Impression / Assessment and Plan / ED Course  I have reviewed the triage vital signs and the nursing notes.  Pertinent labs & imaging results that were available during my care of the patient were reviewed by me and considered in my medical decision making (see chart for details).     Patient seen and examined.  Patient feeling much better after breathing treatment was ordered and administered prior to my exam.  Plan: Discharged home with albuterol inhaler with spacer, course of prednisone x5 days.  Vital signs reviewed and are as follows: BP 126/71 (BP Location: Right Arm)   Pulse 96   Temp 98.3 F (36.8 C) (Oral)   Resp 18   Ht  (1.651 m)   Wt 90.7 kg (200 lb)   LMP 03/20/2018   SpO2 100%   BMI 33.28 kg/m   Encourage patient to follow-up with her doctor as needed, return with worsening shortness of breath despite use of albuterol, fevers, new symptoms or other concerns.  Patient verbalizes understanding and agrees with plan.  Final Clinical Impressions(s) / ED Diagnoses   Final diagnoses:  Exacerbation of asthma, unspecified asthma severity, unspecified whether persistent  Nasal congestion   Patient with 1 day of URI symptoms that has exacerbated her pre-existing asthma.  She did not have medications to use at home prompting emergency department visit.  Her lungs have cleared after administration of one breathing treatment in the emergency department.  She will be given albuterol for home and prednisone prescription.  She is not hypoxic and is no respiratory distress.  Comfortable with discharge home at this time.  ED Discharge Orders         Ordered    predniSONE (DELTASONE) 20 MG tablet  Daily     04/11/18 2046       Renne Crigler, PA-C 04/12/18 0006    Renne Crigler, PA-C 04/12/18 0006    Tegeler, Canary Brim, MD 04/12/18 [phone]

## 2018-04-11 NOTE — Progress Notes (Signed)
Patient states that she is breathing better and is able to take a deep breath after nebulizer treatment.

## 2018-08-21 ENCOUNTER — Encounter: Payer: Self-pay | Admitting: Allergy and Immunology

## 2018-08-21 ENCOUNTER — Ambulatory Visit (INDEPENDENT_AMBULATORY_CARE_PROVIDER_SITE_OTHER): Payer: Medicaid Other | Admitting: Allergy and Immunology

## 2018-08-21 VITALS — BP 116/70 | HR 78 | Temp 98.5°F | Resp 16 | Ht 65.12 in | Wt 238.3 lb

## 2018-08-21 DIAGNOSIS — H1013 Acute atopic conjunctivitis, bilateral: Secondary | ICD-10-CM | POA: Diagnosis not present

## 2018-08-21 DIAGNOSIS — Z720 Tobacco use: Secondary | ICD-10-CM | POA: Insufficient documentation

## 2018-08-21 DIAGNOSIS — Z3491 Encounter for supervision of normal pregnancy, unspecified, first trimester: Secondary | ICD-10-CM | POA: Insufficient documentation

## 2018-08-21 DIAGNOSIS — J3089 Other allergic rhinitis: Secondary | ICD-10-CM | POA: Diagnosis not present

## 2018-08-21 DIAGNOSIS — J4530 Mild persistent asthma, uncomplicated: Secondary | ICD-10-CM

## 2018-08-21 DIAGNOSIS — H1010 Acute atopic conjunctivitis, unspecified eye: Secondary | ICD-10-CM | POA: Insufficient documentation

## 2018-08-21 MED ORDER — ALBUTEROL SULFATE HFA 108 (90 BASE) MCG/ACT IN AERS: INHALATION_SPRAY | RESPIRATORY_TRACT | 2 refills | Status: DC

## 2018-08-21 MED ORDER — BUDESONIDE 32 MCG/ACT NA SUSP: 2.0000 | Freq: Every day | NASAL | 5 refills | Status: DC | PRN

## 2018-08-21 MED ORDER — BUDESONIDE 180 MCG/ACT IN AEPB: 2.0000 | INHALATION_SPRAY | Freq: Two times a day (BID) | RESPIRATORY_TRACT | 5 refills | Status: DC

## 2018-08-21 MED ORDER — OLOPATADINE HCL 0.7 % OP SOLN: 1.0000 [drp] | Freq: Every day | OPHTHALMIC | 5 refills | Status: DC | PRN

## 2018-08-21 NOTE — Patient Instructions (Addendum)
Mild persistent asthma Currently with suboptimal control.  In addition, todays spirometry results, assessed while asymptomatic, suggest under-perception of bronchoconstriction.  Tobacco cessation has been discussed and encouraged.  I have discussed and emphasized the importance of tight control of asthma during pregnancy.  The patient has verbalized understanding.  A prescription has been provided for Pulmicort Flexhaler 180 g, 2 inhalations twice daily.  Albuterol HFA, 1 to 2 inhalations every 4-6 hours if needed.  All medications are to be reviewed with and approved by the patient's obstetrician.  The patient has been asked to contact our office if symptoms persist or progress. Otherwise, she may return for follow up in 4 weeks.  Other allergic rhinitis The patient's history suggests allergic rhinoconjunctivitis.  A prescription has been provided for Rhinocort AQ, 2 sprays per nostril daily as needed.  Nasal saline spray (i.e., Simply Saline) or nasal saline lavage (i.e., NeilMed) is recommended as needed and prior to medicated nasal sprays.  She will return for allergy skin testing after she has delivered.  Allergic conjunctivitis  Treatment plan as outlined above for allergic rhinitis.  A prescription has been provided for Pazeo, one drop per eye daily as needed.  I have also recommended eye lubricant drops (i.e., Natural Tears) as needed.   Return in about 4 weeks (around 09/18/2018), or if symptoms worsen or fail to improve.

## 2018-08-21 NOTE — Assessment & Plan Note (Signed)
The patient's history suggests allergic rhinoconjunctivitis.  A prescription has been provided for Rhinocort AQ, 2 sprays per nostril daily as needed.  Nasal saline spray (i.e., Simply Saline) or nasal saline lavage (i.e., NeilMed) is recommended as needed and prior to medicated nasal sprays.  She will return for allergy skin testing after she has delivered.

## 2018-08-21 NOTE — Assessment & Plan Note (Signed)
   Treatment plan as outlined above for allergic rhinitis.  A prescription has been provided for Pazeo, one drop per eye daily as needed.  I have also recommended eye lubricant drops (i.e., Natural Tears) as needed. 

## 2018-08-21 NOTE — Assessment & Plan Note (Addendum)
Currently with suboptimal control.  In addition, todays spirometry results, assessed while asymptomatic, suggest under-perception of bronchoconstriction.  Tobacco cessation has been discussed and encouraged.  I have discussed and emphasized the importance of tight control of asthma during pregnancy.  The patient has verbalized understanding.  A prescription has been provided for Pulmicort Flexhaler 180 g, 2 inhalations twice daily.  Albuterol HFA, 1 to 2 inhalations every 4-6 hours if needed.  All medications are to be reviewed with and approved by the patient's obstetrician.  The patient has been asked to contact our office if symptoms persist or progress. Otherwise, she may return for follow up in 4 weeks.

## 2018-08-21 NOTE — Progress Notes (Signed)
New Patient Note  RE: Deborah Walters MRN: [id number] DOB: [birth year] Date of Office Visit: 08/21/2018  Referring provider: Jackie Plum, MD Primary care provider: Patient, No Pcp Per  Chief Complaint: Asthma and Allergic Rhinitis    History of present illness: Deborah Walters is a 27 y.o. female seen today in consultation requested by Jackie Plum, MD.  She reports that she is 5-1/[redacted] weeks pregnant and is here for evaluation of asthma.  She has had symptoms consistent with asthma since infancy.  These symptoms include episodes of coughing, wheezing, chest tightness, and dyspnea.  Her asthma symptoms are triggered by upper respiratory tract infections, strong chemicals, rapid weather changes, hot weather, cold weather, cigarette smoke, and pollen exposure.  She currently experiences asthma symptoms 3-4 times per week on average.  She denies limitations in normal daily activities in the absence of known triggers, and denies nocturnal awakenings due to lower respiratory symptoms.  She carries albuterol for rescue but does not have a controller medication currently. Amalia experiences nasal congestion, rhinorrhea, sneezing, nasal pruritus, and ocular pruritus.  These symptoms are most frequent and severe during the springtime and in the fall.  Assessment and plan: Mild persistent asthma Currently with suboptimal control.  In addition, todays spirometry results, assessed while asymptomatic, suggest under-perception of bronchoconstriction.  Tobacco cessation has been discussed and encouraged.  I have discussed and emphasized the importance of tight control of asthma during pregnancy.  The patient has verbalized understanding.  A prescription has been provided for Pulmicort Flexhaler 180 g, 2 inhalations twice daily.  Albuterol HFA, 1 to 2 inhalations every 4-6 hours if needed.  All medications are to be reviewed with and approved by the patient's obstetrician.  The patient has been asked  to contact our office if symptoms persist or progress. Otherwise, she may return for follow up in 4 weeks.  Other allergic rhinitis The patient's history suggests allergic rhinoconjunctivitis.  A prescription has been provided for Rhinocort AQ, 2 sprays per nostril daily as needed.  Nasal saline spray (i.e., Simply Saline) or nasal saline lavage (i.e., NeilMed) is recommended as needed and prior to medicated nasal sprays.  She will return for allergy skin testing after she has delivered.  Allergic conjunctivitis  Treatment plan as outlined above for allergic rhinitis.  A prescription has been provided for Pazeo, one drop per eye daily as needed.  I have also recommended eye lubricant drops (i.e., Natural Tears) as needed.   Meds ordered this encounter  Medications  . budesonide (PULMICORT FLEXHALER) 180 MCG/ACT inhaler    Sig: Inhale 2 puffs into the lungs 2 (two) times daily. Rinse, gargle and spit out after use    Dispense:  1 Inhaler    Refill:  5  . albuterol (PROVENTIL HFA;VENTOLIN HFA) 108 (90 Base) MCG/ACT inhaler    Sig: 1-2 puffs every 4-6 hours as needed for coughing or wheezing    Dispense:  1 Inhaler    Refill:  2  . budesonide (RHINOCORT ALLERGY) 32 MCG/ACT nasal spray    Sig: Place 2 sprays into both nostrils daily as needed.    Dispense:  1 Bottle    Refill:  5  . Olopatadine HCl (PAZEO) 0.7 % SOLN    Sig: Apply 1 drop to eye daily as needed.    Dispense:  1 Bottle    Refill:  5    Diagnostics: Spirometry: FVC was 3.21 L and FEV1 was 2.19 L (75% predicted) with an FEV1 ratio of 78%.  There was significant (430 mL, 20%) postbronchodilator improvement.  This study was performed while the patient was asymptomatic.  Please see scanned spirometry results for details.    Physical examination: Blood pressure 116/70, pulse 78, temperature 98.5 F (36.9 C), temperature source Oral, resp. rate 16, height 5' 5.12" (1.654 m), weight 238 lb 5.1 oz (108.1 kg), SpO2 99  %.  General: Alert, interactive, in no acute distress. HEENT: TMs pearly gray, turbinates moderately edematous with clear discharge, post-pharynx mildly erythematous. Neck: Supple without lymphadenopathy. Lungs: Clear to auscultation without wheezing, rhonchi or rales. CV: Normal S1, S2 without murmurs. Abdomen: Nondistended, nontender. Skin: Warm and dry, without lesions or rashes. Extremities:  No clubbing, cyanosis or edema. Neuro:   Grossly intact.  Review of systems:  Review of systems negative except as noted in HPI / PMHx or noted below: Review of Systems  Constitutional: Negative.   HENT: Negative.   Eyes: Negative.   Respiratory: Negative.   Cardiovascular: Negative.   Gastrointestinal: Negative.   Genitourinary: Negative.   Musculoskeletal: Negative.   Skin: Negative.   Neurological: Negative.   Endo/Heme/Allergies: Negative.   Psychiatric/Behavioral: Negative.     Past medical history:  Past Medical History:  Diagnosis Date  . ADHD (attention deficit hyperactivity disorder)   . Asthma   . Eczema   . Recurrent upper respiratory infection (URI)     Past surgical history:  Past Surgical History:  Procedure Laterality Date  . ADENOIDECTOMY    . ANKLE SURGERY      Family history: Family History  Problem Relation Age of Onset  . Allergic rhinitis Daughter     Social history: Social History   Socioeconomic History  . Marital status: Single    Spouse name: Not on file  . Number of children: Not on file  . Years of education: Not on file  . Highest education level: Not on file  Occupational History  . Not on file  Social Needs  . Financial resource strain: Not on file  . Food insecurity:    Worry: Not on file    Inability: Not on file  . Transportation needs:    Medical: Not on file    Non-medical: Not on file  Tobacco Use  . Smoking status: Current Some Day Smoker    Packs/day: 0.50    Types: Cigarettes  . Smokeless tobacco: Never Used    Substance and Sexual Activity  . Alcohol use: Yes    Comment: occ  . Drug use: No  . Sexual activity: Yes    Birth control/protection: None  Lifestyle  . Physical activity:    Days per week: Not on file    Minutes per session: Not on file  . Stress: Not on file  Relationships  . Social connections:    Talks on phone: Not on file    Gets together: Not on file    Attends religious service: Not on file    Active member of club or organization: Not on file    Attends meetings of clubs or organizations: Not on file    Relationship status: Not on file  . Intimate partner violence:    Fear of current or ex partner: Not on file    Emotionally abused: Not on file    Physically abused: Not on file    Forced sexual activity: Not on file  Other Topics Concern  . Not on file  Social History Narrative  . Not on file   Environmental History: Patient lives  in a house with hardwood floors throughout and central air/heat.  There is no known mold/water damage in the home.  There are no pets in the home.  She started smoking cigarettes in 2016 and has been attempting to quit since she recently found out she was pregnant.  Allergies as of 08/21/2018   No Known Allergies     Medication List        Accurate as of 08/21/18  1:30 PM. Always use your most recent med list.          albuterol 108 (90 Base) MCG/ACT inhaler Commonly known as:  PROVENTIL HFA;VENTOLIN HFA 1-2 puffs every 4-6 hours as needed for coughing or wheezing   budesonide 180 MCG/ACT inhaler Commonly known as:  PULMICORT Inhale 2 puffs into the lungs 2 (two) times daily. Rinse, gargle and spit out after use   budesonide 32 MCG/ACT nasal spray Commonly known as:  RHINOCORT AQUA Place 2 sprays into both nostrils daily as needed.   Olopatadine HCl 0.7 % Soln Apply 1 drop to eye daily as needed.       Known medication allergies: No Known Allergies  I appreciate the opportunity to take part in Yoselyn's care. Please do  not hesitate to contact me with questions.  Sincerely,   R. Jorene Guest, MD

## 2018-08-27 ENCOUNTER — Telehealth: Payer: Self-pay | Admitting: Allergy

## 2018-08-27 NOTE — Telephone Encounter (Signed)
Pulmicort Flexhaler approved. [place] [id number]

## 2018-09-03 ENCOUNTER — Telehealth: Payer: Self-pay | Admitting: *Deleted

## 2018-09-03 NOTE — Telephone Encounter (Signed)
Ok, please put the fmla forms on my desk in [place] and I will get to them when I am there tomorrow. Please fill out what you are able to. Thanks.

## 2018-09-03 NOTE — Telephone Encounter (Signed)
Pt came in regarding her fmla forms- she said she has waited 3 weeks to get them back. She had apt with Dr. Nunzio CobbsBobbitt on 9/11, she works at [employer] and needs these forms because she is exposed to fumes and chemicals that are triggering her asthma. She has not had an apt with her OB yet and that is scheduled for 10/21. Please advise.

## 2018-09-19 ENCOUNTER — Ambulatory Visit (INDEPENDENT_AMBULATORY_CARE_PROVIDER_SITE_OTHER): Payer: Medicaid Other | Admitting: Family Medicine

## 2018-09-19 ENCOUNTER — Encounter: Payer: Self-pay | Admitting: Family Medicine

## 2018-09-19 VITALS — BP 110/64 | HR 78 | Temp 97.6°F | Resp 18

## 2018-09-19 DIAGNOSIS — J3089 Other allergic rhinitis: Secondary | ICD-10-CM | POA: Diagnosis not present

## 2018-09-19 DIAGNOSIS — J4541 Moderate persistent asthma with (acute) exacerbation: Secondary | ICD-10-CM | POA: Diagnosis not present

## 2018-09-19 DIAGNOSIS — Z720 Tobacco use: Secondary | ICD-10-CM | POA: Diagnosis not present

## 2018-09-19 DIAGNOSIS — H1013 Acute atopic conjunctivitis, bilateral: Secondary | ICD-10-CM

## 2018-09-19 DIAGNOSIS — Z3491 Encounter for supervision of normal pregnancy, unspecified, first trimester: Secondary | ICD-10-CM

## 2018-09-19 MED ORDER — BUDESONIDE 180 MCG/ACT IN AEPB: 2.0000 | INHALATION_SPRAY | Freq: Two times a day (BID) | RESPIRATORY_TRACT | 5 refills | Status: DC

## 2018-09-19 NOTE — Progress Notes (Addendum)
[address] [place]  [address] Dept: [phone]  FOLLOW UP NOTE  Patient ID: Deborah Walters, female    DOB: [birth year]  Age: 27 y.o. MRN: [id number] Date of Office Visit: 09/19/2018  Assessment  Chief Complaint: Asthma  HPI Deborah Walters is a 27 year old female who presents to the clinic for a follow up visit. She reports her asthma has not been well controlled as she has been experiencing shortness of breath with mild activity. She is currently using Pulmicort Flexhaler 180 mcg about 2-3 times a week and albuterol 1-2 times a week. She is currently [redacted] weeks pregnant and reports that she is trying to minimize her medication use until she sees her OB/GYN on October 24th. She reports that she continues to smoke 1-2 cigarettes a day. Allergic rhinitis and allergic conjunctivitis are reported as well controlled with no medical intervention. Allergic conjunctivitis has been well controlled with no medical intervention. Her current medications are listed in the chart.    Drug Allergies:  No Known Allergies  Physical Exam: BP 110/64   Pulse 78   Temp 97.6 F (36.4 C) (Oral)   Resp 18   SpO2 98%    Physical Exam  Constitutional: She is oriented to person, place, and time. She appears well-developed and well-nourished.  HENT:  Head: Normocephalic.  Right Ear: External ear normal.  Left Ear: External ear normal.  Nose: Nose normal.  Bilateral nares edematous and erythematous with clear nasal drainage noted. Pharynx normal. Ears normal. Eyes normal.  Eyes: Conjunctivae are normal.  Neck: Normal range of motion. Neck supple.  Cardiovascular: Normal rate, regular rhythm and normal heart sounds.  No murmur noted  Pulmonary/Chest: Effort normal and breath sounds normal.  Lungs clear to auscultation  Musculoskeletal: Normal range of motion.  Neurological: She is alert and oriented to person, place, and time.  Skin: Skin is warm and dry.  Psychiatric: She has a normal mood and  affect. Her behavior is normal. Judgment and thought content normal.  Vitals reviewed.   Diagnostics: FVC 3.78, FEV1 2.43. Predicted FVC 3.37, predicted FEV1 2.91. Sppirometry indicates mild airway obstruction.    Assessment and Plan: 1. Moderate persistent asthma with acute exacerbation   2. Other allergic rhinitis   3. Allergic conjunctivitis of both eyes   4. Tobacco use   5. Pregnant and not yet delivered in first trimester     Meds ordered this encounter  Medications  . budesonide (PULMICORT FLEXHALER) 180 MCG/ACT inhaler    Sig: Inhale 2 puffs into the lungs 2 (two) times daily. Rinse, gargle and spit out after use    Dispense:  1 Inhaler    Refill:  5    Patient Instructions  Moderate persistent asthma, unspecified whether complicated Begin Pulimcort Flexhaler 180- 2 puffs twice a day to prevent cough or wheeze. Continue Ventolin 1-2 puffs every 4-6 hours as needed for cough or wheeze Smoking cessation discussed and strongly encouraged Review all your medications with your obstriction The importance of compliance with asthma controller medication and tight asthma control during pregnancy has been discussed and encouraged. Britnie verbalized understanding   Other allergic rhinitis Continue Rhinocort AQ 2 sprays in each nostril once a day as needed for a stuffy nose Consider nasal saline rinses. Use this before medicated nasal sprays Return for allergy testing after the baby is born.  Allergic conjunctivitis of both eyes Continue Pazeo eye drops one drop in each eye once a day as needed for red or itchy  eyes  Tobacco use Smoking sensation information discussed and provided in writing  Follow up in 4 weeks or sooner if needed   Return in about 4 weeks (around 10/17/2018), or if symptoms worsen or fail to improve.    Thank you for the opportunity to care for this patient.  Please do not hesitate to contact me with questions.  Thermon Leyland, FNP [facility]   _________________________________________________  I have provided oversight concerning [name]'s evaluation and treatment of this patient's health issues addressed during today's encounter.  I agree with the assessment and therapeutic plan as outlined in the note.   Signed,   R Jorene Guest, MD

## 2018-09-19 NOTE — Patient Instructions (Addendum)
Moderate persistent asthma, unspecified whether complicated Begin Pulimcort Flexhaler 180- 2 puffs twice a day to prevent cough or wheeze Continue Ventolin 1-2 puffs every 4-6 hours as needed for cough or wheeze Smoking cessation discussed Review all your medications with your obstriction The need for tight asthma control during pregnancy discussed and Deborah Walters verbalized understanding   Other allergic rhinitis Continue Rhinocort AQ 2 sprays in each nostril once a day as needed for a stuffy nose Consider nasal saline rinses. Use this before medicated nasal sprays Return for allergy testing after the baby is born.  Allergic conjunctivitis of both eyes Continue Pazeo eye drops one drop in each eye once a day as needed for red or itchy eyes  Tobacco use Smoking sensation information discussed and provided in writing  Follow up in 4 weeks or sooner if needed

## 2018-10-06 ENCOUNTER — Emergency Department (HOSPITAL_BASED_OUTPATIENT_CLINIC_OR_DEPARTMENT_OTHER)
Admission: EM | Admit: 2018-10-06 | Discharge: 2018-10-06 | Disposition: A | Discharge status: Home / Self Care | Payer: Medicaid Other | Attending: Emergency Medicine | Admitting: Emergency Medicine

## 2018-10-06 ENCOUNTER — Encounter (HOSPITAL_BASED_OUTPATIENT_CLINIC_OR_DEPARTMENT_OTHER): Payer: Self-pay | Admitting: *Deleted

## 2018-10-06 ENCOUNTER — Other Ambulatory Visit: Payer: Self-pay

## 2018-10-06 ENCOUNTER — Emergency Department (HOSPITAL_BASED_OUTPATIENT_CLINIC_OR_DEPARTMENT_OTHER): Payer: Medicaid Other

## 2018-10-06 DIAGNOSIS — Z79899 Other long term (current) drug therapy: Secondary | ICD-10-CM | POA: Diagnosis not present

## 2018-10-06 DIAGNOSIS — Z87891 Personal history of nicotine dependence: Secondary | ICD-10-CM | POA: Diagnosis not present

## 2018-10-06 DIAGNOSIS — O9989 Other specified diseases and conditions complicating pregnancy, childbirth and the puerperium: Secondary | ICD-10-CM | POA: Diagnosis present

## 2018-10-06 DIAGNOSIS — Y999 Unspecified external cause status: Secondary | ICD-10-CM | POA: Insufficient documentation

## 2018-10-06 DIAGNOSIS — W228XXA Striking against or struck by other objects, initial encounter: Secondary | ICD-10-CM | POA: Diagnosis not present

## 2018-10-06 DIAGNOSIS — O99511 Diseases of the respiratory system complicating pregnancy, first trimester: Secondary | ICD-10-CM | POA: Insufficient documentation

## 2018-10-06 DIAGNOSIS — Z3A12 12 weeks gestation of pregnancy: Secondary | ICD-10-CM | POA: Insufficient documentation

## 2018-10-06 DIAGNOSIS — M79672 Pain in left foot: Secondary | ICD-10-CM | POA: Insufficient documentation

## 2018-10-06 DIAGNOSIS — Y9301 Activity, walking, marching and hiking: Secondary | ICD-10-CM | POA: Diagnosis not present

## 2018-10-06 DIAGNOSIS — Y929 Unspecified place or not applicable: Secondary | ICD-10-CM | POA: Diagnosis not present

## 2018-10-06 NOTE — ED Provider Notes (Signed)
[facility] EMERGENCY DEPARTMENT Provider Note  CSN: [id number] Arrival date & time: 10/06/18  1648  History   Chief Complaint Chief Complaint  Patient presents with  . Foot Injury    12 wk preg    HPI Deborah Walters is a 27 y.o. female with a medical history of asthma and eczema who presented to the ED for   Foot Injury   The incident occurred yesterday. The incident occurred at home. Injury mechanism: Stepped on children's toy which caused her to evert her foot and fell off her porch in the process. The pain is present in the left foot. The quality of the pain is described as aching and throbbing. The pain is at a severity of 6/10. The pain is moderate. Pertinent negatives include no numbness, no inability to bear weight (She has been ambulatory since the injury), no loss of motion, no muscle weakness, no loss of sensation and no tingling. The symptoms are aggravated by bearing weight and palpation. She has tried nothing for the symptoms.    Past Medical History:  Diagnosis Date  . ADHD (attention deficit hyperactivity disorder)   . Asthma   . Eczema   . Recurrent upper respiratory infection (URI)     Patient Active Problem List   Diagnosis Date Noted  . Allergic conjunctivitis of both eyes 09/19/2018  . Mild persistent asthma 08/21/2018  . Other allergic rhinitis 08/21/2018  . Allergic conjunctivitis 08/21/2018  . Tobacco use 08/21/2018  . Pregnant and not yet delivered in first trimester 08/21/2018  . PID (acute pelvic inflammatory disease) 05/27/2014    Past Surgical History:  Procedure Laterality Date  . ADENOIDECTOMY    . ANKLE SURGERY       OB History    Gravida  1   Para  0   Term      Preterm      AB      Living        SAB      TAB      Ectopic      Multiple      Live Births               Home Medications    Prior to Admission medications   Medication Sig Start Date End Date Taking? Authorizing Provider  albuterol  (PROVENTIL HFA;VENTOLIN HFA) 108 (90 Base) MCG/ACT inhaler 1-2 puffs every 4-6 hours as needed for coughing or wheezing 08/21/18  Yes Bobbitt, Heywood Iles, MD  budesonide (PULMICORT FLEXHALER) 180 MCG/ACT inhaler Inhale 2 puffs into the lungs 2 (two) times daily. Rinse, gargle and spit out after use 09/19/18  Yes Ambs, Norvel Richards, FNP  Prenatal Vit-Fe Fumarate-FA (PRENATAL MULTIVITAMIN) TABS tablet Take 1 tablet by mouth daily at 12 noon.   Yes [provider]  budesonide (RHINOCORT ALLERGY) 32 MCG/ACT nasal spray Place 2 sprays into both nostrils daily as needed. 08/21/18   Bobbitt, Heywood Iles, MD  Olopatadine HCl (PAZEO) 0.7 % SOLN Apply 1 drop to eye daily as needed. 08/21/18   Bobbitt, Heywood Iles, MD    Family History Family History  Problem Relation Age of Onset  . Allergic rhinitis Daughter   . Angioedema Neg Hx   . Asthma Neg Hx   . Eczema Neg Hx   . Immunodeficiency Neg Hx   . Urticaria Neg Hx     Social History Social History   Tobacco Use  . Smoking status: Former Smoker    Packs/day: 0.50  Types: Cigarettes  . Smokeless tobacco: Never Used  Substance Use Topics  . Alcohol use: Not Currently    Comment: occ  . Drug use: No     Allergies   Patient has no known allergies.   Review of Systems Review of Systems  Constitutional: Negative.   Musculoskeletal: Positive for arthralgias, gait problem (Walks with limp) and joint swelling.  Skin: Negative.   Neurological: Negative for tingling, weakness and numbness.  All other systems reviewed and are negative.  Physical Exam Updated Vital Signs BP 103/63 (BP Location: Left Arm)   Pulse 81   Temp 99.5 F (37.5 C) (Oral)   Resp 20   Ht 5\' 5"  (1.651 m)   Wt 103.9 kg   LMP 07/07/2018   SpO2 100%   BMI 38.11 kg/m   Physical Exam  Constitutional: She appears well-developed and well-nourished.  Cardiovascular:  Pulses:      Dorsalis pedis pulses are 2+ on the right side, and 2+ on the left side.        Posterior tibial pulses are 2+ on the right side, and 2+ on the left side.  Musculoskeletal:  Bony tenderness along the left 4th and 5th metatarsals with swelling in left when compared to the right. Patient has full ROM of ankle and toes with 5/5 strength. No tenderness of the ankle malleoli. Able to bear weight and ambulate, but does so with limp due to pain.  Neurological: She has normal strength. No sensory deficit. She exhibits normal muscle tone.  Reflex Scores:      Achilles reflexes are 2+ on the right side and 2+ on the left side. Skin: Skin is warm and intact. Capillary refill takes less than 2 seconds. No abrasion and no bruising noted.  Nursing note and vitals reviewed.  ED Treatments / Results  Labs (all labs ordered are listed, but only abnormal results are displayed) Labs Reviewed - No data to display  EKG None  Radiology No results found.  Procedures Procedures (including critical care time)  Medications Ordered in ED Medications - No data to display   Initial Impression / Assessment and Plan / ED Course  Triage vital signs and the nursing notes have been reviewed.  Pertinent labs & imaging results that were available during care of the patient were reviewed and considered in medical decision making (see chart for details).  Patient presents for foot injury which occurred yesterday where she everted her foot. She has been ambulatory since the accident, but endorses worsening pain in doing so. On physical exam, there is bony tenderness along the left 4th and 5th metatarsals without any deformities, step offs or crepitus. Fortunately, patient has full sensation, full active and passive ROM. No deformities, decreased muscle tone or other abnormalities visualized. Neurovascular function is intact and lower leg compartments are soft. There are no other physical exam findings or s/s that suggest an underlying infectious or rheumatologic process that warrant further  evaluation or intervention today. Will obtain imaging for further evaluation of bony injury to the affected foot.  Clinical Course as of Oct 07 1751  Wynelle Link Oct 06, 2018  1752 No fractures or dislocations in left foot seen on x-ray.   [GM]    Clinical Course User Index [GM] Treva Huyett, Sharyon Medicus, PA-C   Final Clinical Impressions(s) / ED Diagnoses  1. Left Foot Pain. Education provided on OTC and supportive treatment for pain relief and inflammation.  Dispo: Home. After thorough clinical evaluation, this patient is determined to  be medically stable and can be safely discharged with the previously mentioned treatment and/or outpatient follow-up/referral(s). At this time, there are no other apparent medical conditions that require further screening, evaluation or treatment.  Final diagnoses:  Left foot pain    ED Discharge Orders    None        Windy Carina, [place] 10/06/18 1753    Loren Racer, MD 10/06/18 2330

## 2018-10-06 NOTE — Discharge Instructions (Signed)
X-ray shows that you have not broken or dislocated your foot.  For pain, you may use Tylenol.. You may also use cold compresses for additional relief in 15-20 minute intervals. When you are at home, keep your foot elevated. You may follow-up with your PCP or ob/gyn if you continue to have issues for more than 4-6 weeks.  Thank you for allowing [place] to take care of you today.

## 2018-10-06 NOTE — ED Triage Notes (Signed)
Pt reports she stepped on a child's toy and twisted her left foot the "fell off the porch". C/o continued pain worse with walking

## 2018-10-17 ENCOUNTER — Ambulatory Visit: Payer: Medicaid Other | Admitting: Family Medicine

## 2018-10-21 ENCOUNTER — Encounter: Payer: Self-pay | Admitting: Family Medicine

## 2018-10-21 ENCOUNTER — Ambulatory Visit (INDEPENDENT_AMBULATORY_CARE_PROVIDER_SITE_OTHER): Payer: Medicaid Other | Admitting: Family Medicine

## 2018-10-21 VITALS — BP 124/58 | HR 84 | Temp 98.1°F | Resp 16

## 2018-10-21 DIAGNOSIS — J3089 Other allergic rhinitis: Secondary | ICD-10-CM | POA: Diagnosis not present

## 2018-10-21 DIAGNOSIS — H1013 Acute atopic conjunctivitis, bilateral: Secondary | ICD-10-CM | POA: Diagnosis not present

## 2018-10-21 DIAGNOSIS — Z720 Tobacco use: Secondary | ICD-10-CM | POA: Diagnosis not present

## 2018-10-21 DIAGNOSIS — Z3A20 20 weeks gestation of pregnancy: Secondary | ICD-10-CM | POA: Insufficient documentation

## 2018-10-21 DIAGNOSIS — J4540 Moderate persistent asthma, uncomplicated: Secondary | ICD-10-CM | POA: Insufficient documentation

## 2018-10-21 DIAGNOSIS — Z3490 Encounter for supervision of normal pregnancy, unspecified, unspecified trimester: Secondary | ICD-10-CM

## 2018-10-21 NOTE — Progress Notes (Signed)
[address] [place] [address] Dept: [phone]  FOLLOW UP NOTE  Patient ID: Deborah Walters, female    DOB: 10-03-1991  Age: 27 y.o. MRN: [id number] Date of Office Visit: 10/21/2018  Assessment  Chief Complaint: Asthma and Nasal Congestion  HPI Deborah Walters is a 27 year old female who presents to the clinic for a follow up visit. She reports asthma as well controlled with no shortness of breath or wheeze. She repots a cough with clear mucus production that began yesterday. She has not been using Pulmicort or albuterol with the exception of this morning when she used her albuterol inhaler. She continues to smoke 1-2 cigarettes a day. Allergic rhinitis is reported as not well controlled with nasal congestion as the major symptom for which she used Rhinocort nasal spray today for the first time since her last visit to this office. Allergic conjunctivitis is well controlled. Her current medications are listed in the chart.  She is reportedly [redacted] weeks pregnant   Drug Allergies:  No Known Allergies  Physical Exam: BP (!) 124/58   Pulse 84   Temp 98.1 F (36.7 C) (Oral)   Resp 16   LMP 07/07/2018   SpO2 98%    Physical Exam  Constitutional: She is oriented to person, place, and time. She appears well-developed and well-nourished.  HENT:  Head: Normocephalic.  Right Ear: External ear normal.  Left Ear: External ear normal.  Mouth/Throat: Oropharynx is clear and moist.  Bilateral nares edematous and pale with clear nasal drainage noted. Pharynx normal. Ears normal. Eyes normal.   Eyes: Conjunctivae are normal.  Neck: Normal range of motion. Neck supple.  Cardiovascular: Normal rate, regular rhythm and normal heart sounds.  No murmur noted  Pulmonary/Chest: Effort normal and breath sounds normal.  Lungs clear to auscultation  Musculoskeletal: Normal range of motion.  Neurological: She is alert and oriented to person, place, and time.  Skin: Skin is warm and dry.  Psychiatric:  She has a normal mood and affect. Her behavior is normal. Judgment and thought content normal.  Vitals reviewed.   Diagnostics: FVC 3.38 FEV1 2.47. Predicted FVC 3.37, predicted FEV1 2.91. Spirometry is within the normal range.   Assessment and Plan: 1. Moderate persistent asthma without complication   2. Allergic conjunctivitis of both eyes   3. Other allergic rhinitis   4. Tobacco use   5. Pregnancy, unspecified gestational age    Moderate persistent asthma, unspecified whether complicated Begin Pulimcort Flexhaler 180- 2 puffs once a day with a spacer to prevent cough or wheeze Continue Ventolin 1-2 puffs every 4-6 hours as needed for cough or wheeze Smoking cessation discussed Continue to review all your medications with your obstriction The need for tight asthma control during pregnancy discussed and Deborah Walters verbalized understanding   Other allergic rhinitis Continue Rhinocort AQ 2 sprays in each nostril once a day for a stuffy nose Consider nasal saline rinses. Use this before medicated nasal sprays Return for allergy testing after the baby is born.  Allergic conjunctivitis of both eyes Continue Pazeo eye drops one drop in each eye once a day as needed for red or itchy eyes  Tobacco use Smoking sensation information discussed   Follow up in 4 weeks or sooner if needed  Return in about 4 weeks (around 11/18/2018), or if symptoms worsen or fail to improve.   Thank you for the opportunity to care for this patient.  Please do not hesitate to contact me with questions.  Thermon Leyland,  FNP [facility]  I have provided oversight concerning Thermon Leyland' evaluation and treatment of this patient's health issues addressed during today's encounter. I agree with the assessment and therapeutic plan as outlined in the note.   Thank you for the opportunity to care for this patient.  Please do not hesitate to contact me with  questions.  Tonette Bihari, M.D.  [facility] [address] [place], [place] [address] [phone] , predicted FEV1

## 2018-10-21 NOTE — Patient Instructions (Addendum)
Moderate persistent asthma, unspecified whether complicated Begin Pulimcort Flexhaler 180- 2 puffs once a day with a spacer to prevent cough or wheeze Continue Ventolin 1-2 puffs every 4-6 hours as needed for cough or wheeze Smoking cessation discussed Continue to review all your medications with your obstriction The need for tight asthma control during pregnancy discussed and Deborah Walters verbalized understanding   Other allergic rhinitis Continue Rhinocort AQ 2 sprays in each nostril once a day for a stuffy nose Consider nasal saline rinses. Use this before medicated nasal sprays Return for allergy testing after the baby is born.  Allergic conjunctivitis of both eyes Continue Pazeo eye drops one drop in each eye once a day as needed for red or itchy eyes  Tobacco use Smoking sensation information discussed   Follow up in 4 weeks or sooner if needed

## 2018-11-20 ENCOUNTER — Ambulatory Visit: Payer: Medicaid Other | Admitting: Family Medicine

## 2018-11-21 ENCOUNTER — Ambulatory Visit: Payer: Medicaid Other | Admitting: Family Medicine

## 2018-11-21 NOTE — Progress Notes (Deleted)
   [address] [place] [id number] Dept: [phone]  FOLLOW UP NOTE  Patient ID: Deborah Walters, female    DOB: Aug 05, 1991  Age: 27 y.o. MRN: [id number] Date of Office Visit: 11/21/2018  Assessment  Chief Complaint: No chief complaint on file.  HPI Deborah Walters is a 27 year old female who presents to the clinic for a follow up visit. She was last seen in this clinic on 10/21/2018 by Thermon LeylandAnne Mailynn Everly, NP for evaluation of asthma, allergic rhinoconjunctivitis, and tobacco abuse during pregnancy.  Drug Allergies:  No Known Allergies  Physical Exam: LMP 07/07/2018    Physical Exam  Diagnostics:    Assessment and Plan: No diagnosis found.  No orders of the defined types were placed in this encounter.   There are no Patient Instructions on file for this visit.  No follow-ups on file.    Thank you for the opportunity to care for this patient.  Please do not hesitate to contact me with questions.  Thermon LeylandAnne Pasqual Farias, FNP [facility]

## 2018-11-26 ENCOUNTER — Encounter: Payer: Self-pay | Admitting: Family Medicine

## 2018-11-26 ENCOUNTER — Ambulatory Visit (INDEPENDENT_AMBULATORY_CARE_PROVIDER_SITE_OTHER): Payer: Medicaid Other | Admitting: Family Medicine

## 2018-11-26 VITALS — BP 104/62 | HR 79 | Temp 99.0°F | Resp 16

## 2018-11-26 DIAGNOSIS — Z720 Tobacco use: Secondary | ICD-10-CM

## 2018-11-26 DIAGNOSIS — H1013 Acute atopic conjunctivitis, bilateral: Secondary | ICD-10-CM | POA: Diagnosis not present

## 2018-11-26 DIAGNOSIS — J3089 Other allergic rhinitis: Secondary | ICD-10-CM

## 2018-11-26 DIAGNOSIS — Z3A24 24 weeks gestation of pregnancy: Secondary | ICD-10-CM

## 2018-11-26 DIAGNOSIS — J4540 Moderate persistent asthma, uncomplicated: Secondary | ICD-10-CM | POA: Diagnosis not present

## 2018-11-26 NOTE — Patient Instructions (Addendum)
Moderate persistent asthma, unspecified whether complicated Continue Pulimcort Flexhaler 180- 1 puff once a day to prevent cough or wheeze. Increase to 2 puffs if you begin to cough or wheeze and call the clinic Continue Ventolin 1-2 puffs every 4-6 hours as needed for cough or wheeze Smoking cessation discussed Continue to review all your medications with your obstriction The need for tight asthma control during pregnancy discussed and Deborah Glassmanyler verbalized understanding   Other allergic rhinitis Continue Rhinocort AQ 1 spray in each nostril once a day for a stuffy nose Consider nasal saline rinses. Use this before medicated nasal sprays  Allergic conjunctivitis of both eyes Continue Pazeo eye drops one drop in each eye once a day as needed for red or itchy eyes  Tobacco use Smoking sensation information discussed   Call Deborah Walters if this treatment plan is not working well for you  Follow up in 3 months or sooner if needed

## 2018-11-26 NOTE — Progress Notes (Signed)
[address] [place] [id number] Dept: [phone]  FOLLOW UP NOTE  Patient ID: Deborah Walters, female    DOB: 17-Feb-1991  Age: 27 y.o. MRN: [id number] Date of Office Visit: 11/26/2018  Assessment  Chief Complaint: Asthma  HPI Deborah Walters is a 27 year old female who presents to the clinic for a follow up visit. She reports that her asthma has been well controlled since her last visit as she has started using her asthma controller medications on a regular basis. She denies shortness of breath, wheeze, or cough with rest or activity. She is using Pulmicort 180-2 puffs once a day and not using albuterol at this time. Allergic rhinitis and allergic conjunctivitis are reported as well controlled with no medical intervention at this time. She continues to smoke about 2 cigarettes a day. Her current medications are listed in the chart.    Drug Allergies:  No Known Allergies  Physical Exam: BP 104/62   Pulse 79   Temp 99 F (37.2 C) (Oral)   Resp 16   LMP 07/07/2018   SpO2 99%    Physical Exam Constitutional:      Appearance: Normal appearance.  HENT:     Head: Normocephalic.     Right Ear: Tympanic membrane normal.     Left Ear: Tympanic membrane normal.     Nose:     Comments: Bilateral nares edematous and pale with clear nasal drainage noted. Pharynx normal. Ears normal. Eyes normal.    Mouth/Throat:     Pharynx: Oropharynx is clear.  Eyes:     Conjunctiva/sclera: Conjunctivae normal.  Neck:     Musculoskeletal: Normal range of motion and neck supple.  Cardiovascular:     Rate and Rhythm: Normal rate and regular rhythm.     Heart sounds: Normal heart sounds.     Comments: No murmur noted Pulmonary:     Effort: Pulmonary effort is normal.     Breath sounds: Normal breath sounds.     Comments: Lungs clear to auscultation Musculoskeletal: Normal range of motion.  Skin:    General: Skin is warm and dry.  Neurological:     Mental Status: She is alert and oriented to  person, place, and time.  Psychiatric:        Mood and Affect: Mood normal.        Behavior: Behavior normal.        Thought Content: Thought content normal.        Judgment: Judgment normal.     Diagnostics: FVC 3.67, FEV1 2.56. Predicted FVC 3.37, predicted FEV1 2.91. Spirometry is within normal limits.  Assessment and Plan: 1. Moderate persistent asthma without complication   2. [redacted] weeks gestation of pregnancy   3. Allergic conjunctivitis of both eyes   4. Other allergic rhinitis   5. Tobacco use      Patient Instructions  Moderate persistent asthma, unspecified whether complicated Continue Pulimcort Flexhaler 180- 1 puff once a day to prevent cough or wheeze. Increase to 2 puffs if you begin to cough or wheeze and call the clinic Continue Ventolin 1-2 puffs every 4-6 hours as needed for cough or wheeze Smoking cessation discussed Continue to review all your medications with your obstriction The need for tight asthma control during pregnancy discussed and Deborah Walters verbalized understanding   Other allergic rhinitis Continue Rhinocort AQ 1 spray in each nostril once a day for a stuffy nose Consider nasal saline rinses. Use this before medicated nasal sprays  Allergic conjunctivitis of  both eyes Continue Pazeo eye drops one drop in each eye once a day as needed for red or itchy eyes  Tobacco use Smoking sensation information discussed   Call us if this treatment plan is not working well for you  Follow up in 3 months or sooner if needed   Return in about 3 months (around 02/25/2019), or if symptoms worsen or fail to improve.   Thank you for the opportunity to care for this patient.  Please do not hesitate to contact me with questions.  Thermon Leyland, FNP [facility]  I have provided oversight concerning Thermon Leyland' evaluation and treatment of this patient's health issues addressed during today's encounter. I agree  with the assessment and therapeutic plan as outlined in the note.   Thank you for the opportunity to care for this patient.  Please do not hesitate to contact me with questions.  Tonette Bihari, M.D.  [facility] [address] [place], [place] [address] [phone]

## 2018-11-27 ENCOUNTER — Telehealth: Payer: Self-pay | Admitting: Family Medicine

## 2018-11-27 NOTE — Telephone Encounter (Signed)
Left a message for Deborah Walters to call me at the office regarding her inhaler technique and if she is using a spacer with the maintenance inhaler at this time. I have left instructions for her not to use a spacer with the Pulmicort inhaler and also to call me at the office to confirm that she has received and understands this message.

## 2018-11-27 NOTE — Telephone Encounter (Signed)
PT CALLED BACK TO SPEAK TO ANNE REGARDING MESSAGE LEFT ABOUT INHALER USE. PLEASE CALL PT BACK. SHE WILL BE ON STANDBY.

## 2018-11-27 NOTE — Telephone Encounter (Signed)
Patient reports she has not been using a spacer with her Pulmicort. She verbalized understanding of instructions not to begin using a spacer with the Pulmicort inhaler.

## 2019-03-29 IMAGING — DX DG FOOT COMPLETE 3+V*L*
3 series · 3 of 3 positions shown · non-contrast
Comparison: None.

CLINICAL DATA: Foot pain

EXAM:
LEFT FOOT - COMPLETE 3+ VIEW

[foot ap]
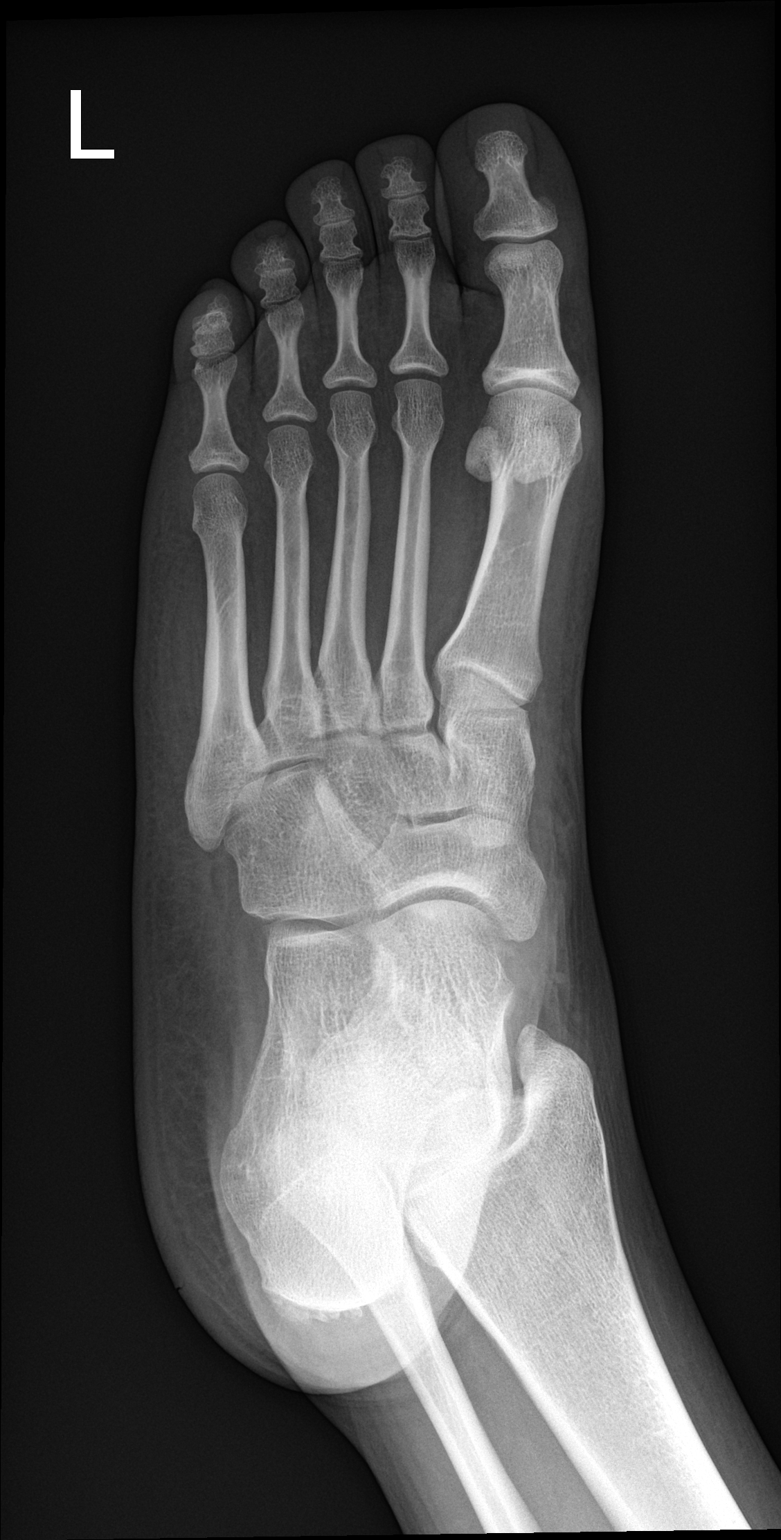

[foot obl]
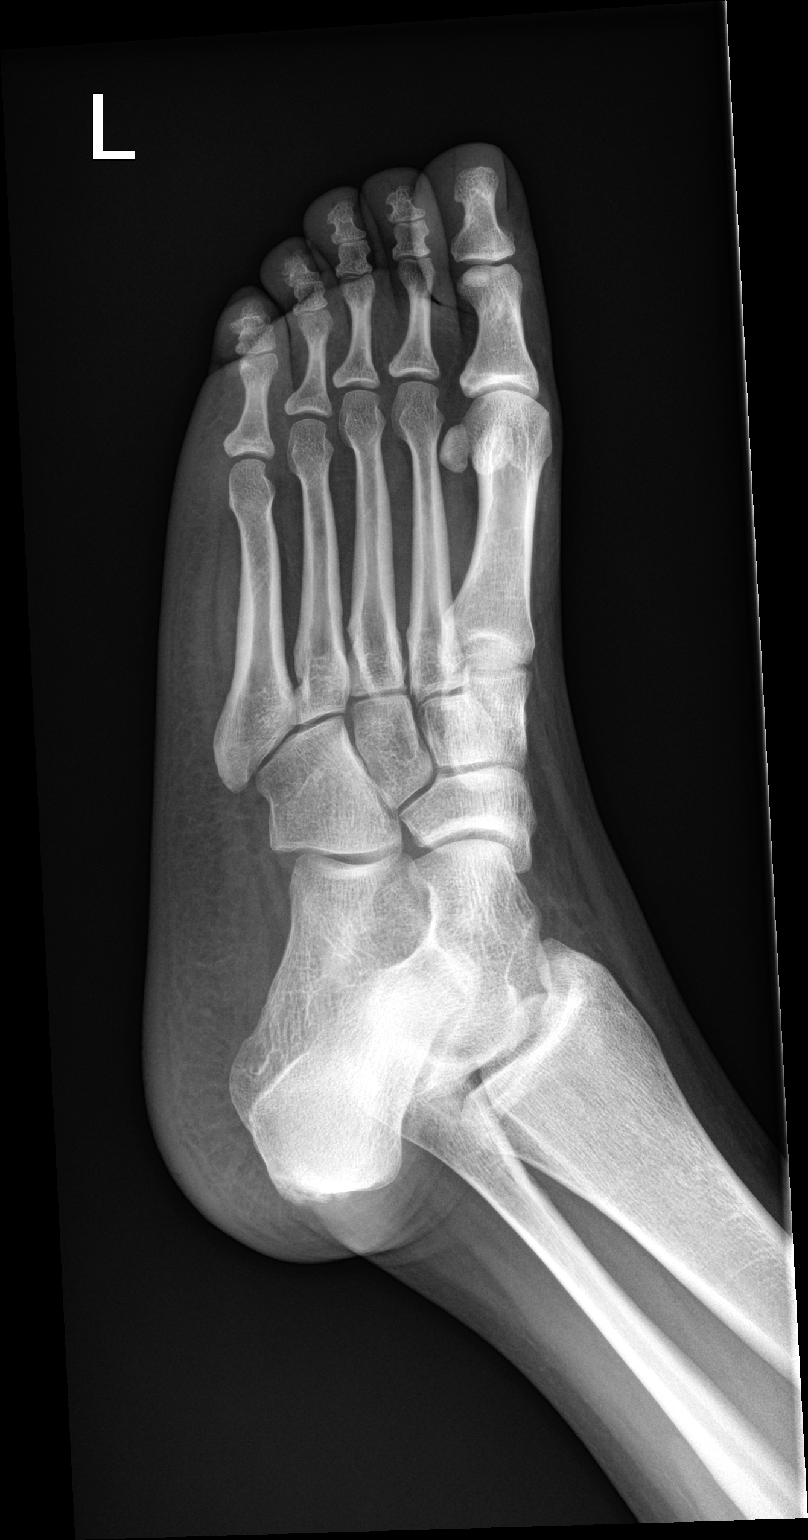

[foot lat]
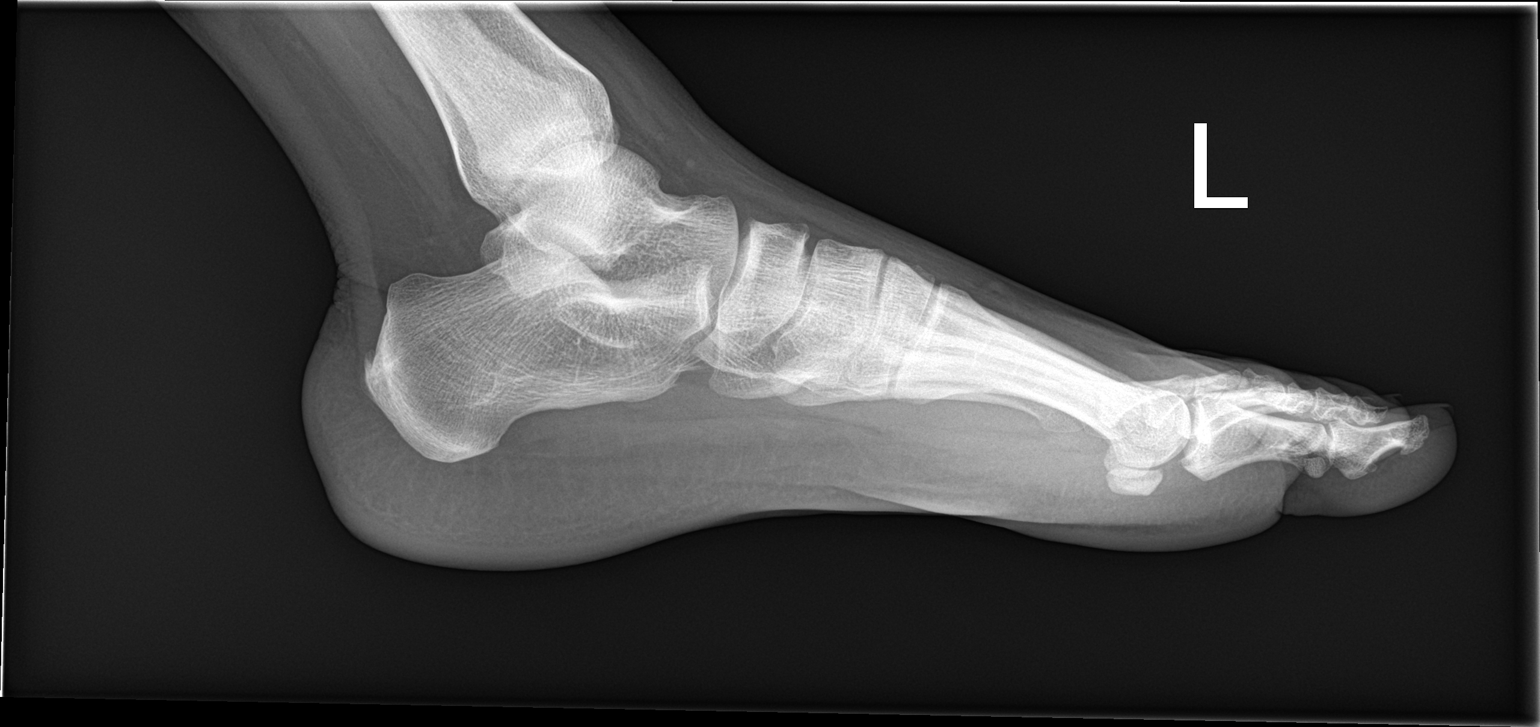

[3 of 3 positions shown; findings below may reference images not displayed]

FINDINGS: There is no evidence of fracture or dislocation. There is no
evidence of arthropathy or other focal bone abnormality. Dorsal
calcaneal enthesopathy. Soft tissues are unremarkable.
IMPRESSION: Posterior calcaneal enthesophyte.  No acute osseous abnormality.

## 2019-05-15 ENCOUNTER — Other Ambulatory Visit: Payer: Self-pay

## 2019-05-15 ENCOUNTER — Ambulatory Visit (INDEPENDENT_AMBULATORY_CARE_PROVIDER_SITE_OTHER): Payer: Medicaid Other | Admitting: Family Medicine

## 2019-05-15 ENCOUNTER — Encounter: Payer: Self-pay | Admitting: Family Medicine

## 2019-05-15 VITALS — BP 136/80 | HR 88 | Temp 99.4°F | Resp 20 | Ht 65.0 in | Wt 218.0 lb

## 2019-05-15 DIAGNOSIS — H1013 Acute atopic conjunctivitis, bilateral: Secondary | ICD-10-CM

## 2019-05-15 DIAGNOSIS — Z720 Tobacco use: Secondary | ICD-10-CM

## 2019-05-15 DIAGNOSIS — J3089 Other allergic rhinitis: Secondary | ICD-10-CM | POA: Diagnosis not present

## 2019-05-15 DIAGNOSIS — J4520 Mild intermittent asthma, uncomplicated: Secondary | ICD-10-CM | POA: Diagnosis not present

## 2019-05-15 MED ORDER — ALBUTEROL SULFATE HFA 108 (90 BASE) MCG/ACT IN AERS: INHALATION_SPRAY | RESPIRATORY_TRACT | 1 refills | Status: DC

## 2019-05-15 MED ORDER — FLUTICASONE PROPIONATE HFA 110 MCG/ACT IN AERO: 2.0000 | INHALATION_SPRAY | Freq: Two times a day (BID) | RESPIRATORY_TRACT | 5 refills | Status: DC | PRN

## 2019-05-15 MED ORDER — FLUTICASONE PROPIONATE 50 MCG/ACT NA SUSP: 2.0000 | Freq: Every day | NASAL | 5 refills | Status: DC

## 2019-05-15 NOTE — Patient Instructions (Addendum)
Asthma Continue albuterol inhaler 2 puffs every 4 hours as needed for cough or wheeze. You may use albuterol 2 puffs 5-15 minutes before activity to decrease cough or wheeze. For asthma flares, begin Flovent 110-2 puffs twice a day with a spacer for 2 weeks or until cough and wheeze free. This will replace Pulmicort  Allergic rhinitis Begin Flonase 1-2 sprays in each nostril once a day as needed for a stuffy nose. This will replace Rhinocort nasal spray Consider nasal rinses as needed for nasal symptoms (NeilMed sample given)  Allergic conjunctivitis Continue Pazeo eye drops one drop in each eye once a day as needed for red itchy eyes  Tobacco use Smoking cessation discussed and written information provided  Follow up in 3 months or sooner if needed

## 2019-05-15 NOTE — Progress Notes (Addendum)
[address] [place] [id number] Dept: [phone]  FOLLOW UP NOTE  Patient ID: Deborah Walters, female    DOB: 07/25/1991  Age: 28 y.o. MRN: [id number] Date of Office Visit: 05/15/2019  Assessment  Chief Complaint: Asthma  HPI Deborah Walters is a 28 year old female who presents to the clinic for a follow up visit. She was last seen in the clinic on 11/26/2018 by Thermon LeylandAnne Tyja Gortney, FNP for evaluation of asthma, allergic rhinitis, allergic conjunctivitis, and tobacco use during pregnancy. At today's visit, she reports her asthma has been well controlled with no shortness of breath, cough or wheeze with activity or rest. She reports that she occasionally experiences shortness of breath when using strong cleaning products for which she uses albuterol with relief of symptoms. She is currently using albuterol 1-2 times a week with relief of symptoms. She reports that she uses Pulmicort about once every couple of months only if her albuterol does not work. She continues to smoke between 4 and 7 cigarettes a day. She is not currently breast feeding and does not plan to restart breast feeding. Allergic rhinitis and allergic conjunctivitis are reported as well controlled with no medical intervention. Her current medications are listed in the chart.   Drug Allergies:  No Known Allergies  Physical Exam: Blood pressure 136/80, pulse 88, temperature 99.4 F (37.4 C), temperature source Oral, resp. rate 20, height 5\' 5"  (1.651 m), weight 218 lb 0.6 oz (98.9 kg), last menstrual period 07/07/2018, SpO2 99 %.  Physical Exam Vitals signs reviewed.  Constitutional:      Appearance: Normal appearance.  HENT:     Head: Normocephalic and atraumatic.     Right Ear: Tympanic membrane normal.     Nose:     Comments: Bilateral nares edematous and pale with clear nasal drainage noted. Pharynx normal. Ears normal. Eyes normal.    Mouth/Throat:     Pharynx: Oropharynx is clear.  Eyes:     Conjunctiva/sclera:  Conjunctivae normal.  Neck:     Musculoskeletal: Normal range of motion and neck supple.  Cardiovascular:     Rate and Rhythm: Normal rate and regular rhythm.     Heart sounds: Normal heart sounds. No murmur.  Pulmonary:     Effort: Pulmonary effort is normal.     Breath sounds: Normal breath sounds.     Comments: Lungs clear to auscultation Musculoskeletal: Normal range of motion.  Skin:    General: Skin is warm and dry.  Neurological:     Mental Status: She is alert and oriented to person, place, and time.  Psychiatric:        Mood and Affect: Mood normal.        Behavior: Behavior normal.        Thought Content: Thought content normal.        Judgment: Judgment normal.     Diagnostics: FVC 3.90, FEV1 2.81. Predicted FVC 3.37, predicted FEV1 2.91. Spirometry is within the normal range.   Assessment and Plan: 1. Mild intermittent asthma without complication   2. Allergic conjunctivitis of both eyes   3. Other allergic rhinitis   4. Tobacco use     Meds ordered this encounter  Medications  . albuterol (VENTOLIN HFA) 108 (90 Base) MCG/ACT inhaler    Sig: 1-2 puffs every 4-6 hours as needed for coughing or wheezing    Dispense:  1 Inhaler    Refill:  1  . fluticasone (FLOVENT HFA) 110 MCG/ACT inhaler  Sig: Inhale 2 puffs into the lungs 2 (two) times daily as needed. For asthma flares, use Flovent 110-2 puffs twice a day with a spacer for 2 weeks of until cough and wheeze free    Dispense:  1 Inhaler    Refill:  5  . fluticasone (FLONASE) 50 MCG/ACT nasal spray    Sig: Place 2 sprays into both nostrils daily.    Dispense:  1 g    Refill:  5    Patient Instructions  Asthma Continue albuterol inhaler 2 puffs every 4 hours as needed for cough or wheeze. You may use albuterol 2 puffs 5-15 minutes before activity to decrease cough or wheeze. For asthma flares, begin Flovent 110-2 puffs twice a day with a spacer for 2 weeks or until cough and wheeze free. This will  replace Pulmicort  Allergic rhinitis Begin Flonase 1-2 sprays in each nostril once a day as needed for a stuffy nose. This will replace Rhinocort nasal spray Consider nasal rinses as needed for nasal symptoms (NeilMed sample given)  Allergic conjunctivitis Continue Pazeo eye drops one drop in each eye once a day as needed for red itchy eyes  Tobacco use Smoking cessation discussed and written information provided  Follow up in 3 months or sooner if needed   Return in about 3 months (around 08/15/2019).    Thank you for the opportunity to care for this patient.  Please do not hesitate to contact me with questions.  Thermon Leyland, FNP [facility]  _________________________________________________  I have provided oversight concerning Deborah Walters's evaluation and treatment of this patient's health issues addressed during today's encounter.  I agree with the assessment and therapeutic plan as outlined in the note.   Signed,   R Jorene Guest, MD

## 2019-05-20 ENCOUNTER — Telehealth: Payer: Self-pay | Admitting: *Deleted

## 2019-05-20 NOTE — Telephone Encounter (Signed)
Pt needs a letter in order to return to work saying that her asthma is well under control. Patient would like a call once the letter is ready so that she can come and pick it up.

## 2019-05-20 NOTE — Telephone Encounter (Signed)
Deborah Walters will be back in the clinic tomorrow.

## 2019-05-21 ENCOUNTER — Encounter: Payer: Self-pay | Admitting: *Deleted

## 2019-05-21 NOTE — Telephone Encounter (Signed)
Anne waiting for return call from Pt

## 2019-05-21 NOTE — Telephone Encounter (Signed)
Pt informed letter was ready.  

## 2019-05-22 NOTE — Telephone Encounter (Signed)
Thank you :)

## 2019-11-11 DIAGNOSIS — B958 Unspecified staphylococcus as the cause of diseases classified elsewhere: Secondary | ICD-10-CM

## 2019-11-11 HISTORY — DX: Unspecified staphylococcus as the cause of diseases classified elsewhere: B95.8

## 2020-06-08 ENCOUNTER — Ambulatory Visit (INDEPENDENT_AMBULATORY_CARE_PROVIDER_SITE_OTHER): Payer: Medicaid Other | Admitting: Family Medicine

## 2020-06-08 ENCOUNTER — Encounter: Payer: Self-pay | Admitting: Family Medicine

## 2020-06-08 ENCOUNTER — Telehealth: Payer: Self-pay | Admitting: Family Medicine

## 2020-06-08 ENCOUNTER — Other Ambulatory Visit: Payer: Self-pay

## 2020-06-08 VITALS — BP 124/68 | HR 88 | Temp 98.9°F | Resp 20

## 2020-06-08 DIAGNOSIS — H1013 Acute atopic conjunctivitis, bilateral: Secondary | ICD-10-CM | POA: Diagnosis not present

## 2020-06-08 DIAGNOSIS — J4541 Moderate persistent asthma with (acute) exacerbation: Secondary | ICD-10-CM

## 2020-06-08 DIAGNOSIS — J3089 Other allergic rhinitis: Secondary | ICD-10-CM

## 2020-06-08 DIAGNOSIS — K219 Gastro-esophageal reflux disease without esophagitis: Secondary | ICD-10-CM

## 2020-06-08 DIAGNOSIS — Z720 Tobacco use: Secondary | ICD-10-CM | POA: Diagnosis not present

## 2020-06-08 MED ORDER — FLUTICASONE PROPIONATE 50 MCG/ACT NA SUSP: 2.0000 | Freq: Every day | NASAL | 5 refills | Status: DC

## 2020-06-08 MED ORDER — FAMOTIDINE 20 MG PO TABS: 20.0000 mg | ORAL_TABLET | Freq: Two times a day (BID) | ORAL | 5 refills | Status: DC | PRN

## 2020-06-08 MED ORDER — BUDESONIDE-FORMOTEROL FUMARATE 160-4.5 MCG/ACT IN AERO: 2.0000 | INHALATION_SPRAY | Freq: Two times a day (BID) | RESPIRATORY_TRACT | 5 refills | Status: AC

## 2020-06-08 NOTE — Telephone Encounter (Signed)
Patient notified of change to after visit summary and agrees to begin famotidine 20 mg twice a day to reduce reflux which may help with her asthma flare.

## 2020-06-08 NOTE — Progress Notes (Addendum)
[address] [place] [address] Dept: [phone]  FOLLOW UP NOTE  Patient ID: Deborah Walters, female    DOB: 1991-09-18  Age: 29 y.o. MRN: [id number] Date of Office Visit: 06/08/2020  Assessment  Chief Complaint: Shortness of Breath  HPI Deborah Walters is a 29 year old female who presents to the clinic for a follow up visit. She was last seen in this clinic on 05/15/2019 for evaluation of asthma, allergic rhinitis, allergic conjunctivitis and tobacco use. At today's visit, she reports that she began to experience a " head cold" over the weekend which has progressed into an asthma exacerbation.  Asthma is reported as poorly controlled with shortness of breath and wheeze with activity and rest in addition to intermittent cough which is sometimes dry and sometimes producing clear yellow mucus.  She reports that she has been out of her inhalers for about 1 year.  Allergic rhinitis is reported as not well controlled with symptoms including nasal congestion, postnasal drainage, sneezing, and rhinorrhea mixed with dry flaky drainage.  She is not currently using an antihistamine, Mucinex, Flonase, or saline nasal rinses.  She reports that she continues to smoke about 5 to 6 cigarettes every day.  Reflux is reported as moderately well controlled with heartburn occurring intermittently throughout the day for the last week or so.  She is not currently taking any medication for reflux. Patient reports she is not pregnant. Her current medications are listed in the chart.   Drug Allergies:  No Known Allergies  Physical Exam: BP 124/68   Pulse 88   Temp 98.9 F (37.2 C) (Oral)   Resp 20   SpO2 96%    Physical Exam Vitals reviewed.  Constitutional:      Appearance: Normal appearance. She is well-developed.  HENT:     Head: Normocephalic and atraumatic.     Right Ear: Tympanic membrane normal.     Left Ear: Tympanic membrane normal.     Nose:     Comments: Bilateral nares edematous and  erythematous with clear nasal drainage noted.  Pharynx erythematous with no exudate.  Ears normal.  Eyes normal. Eyes:     Conjunctiva/sclera: Conjunctivae normal.  Cardiovascular:     Rate and Rhythm: Normal rate and regular rhythm.     Heart sounds: Normal heart sounds. No murmur heard.   Pulmonary:     Effort: Pulmonary effort is normal.     Breath sounds: Normal breath sounds.     Comments: Bilateral scattered expiratory wheezing which cleared post bronchodilator therapy. Musculoskeletal:        General: Normal range of motion.     Cervical back: Normal range of motion and neck supple.  Skin:    General: Skin is warm and dry.  Neurological:     Mental Status: She is alert and oriented to person, place, and time.  Psychiatric:        Mood and Affect: Mood normal.        Behavior: Behavior normal.        Thought Content: Thought content normal.        Judgment: Judgment normal.     Diagnostics: FVC 1.91, FEV1 1.20.  Predicted FVC 3.36, predicted FEV1 2.90.  Spirometry indicates moderate restriction and mild airway obstruction.  Postbronchodilator therapy FVC 2.43, FEV1 1.67.  Postbronchodilator therapy indicates mild restriction with 27% improvement in FVC and 39% improvement in FEV1.  Assessment and Plan: 1. Moderate persistent asthma with acute exacerbation   2. Tobacco  use   3. Other allergic rhinitis   4. Allergic conjunctivitis of both eyes   5. Gastroesophageal reflux disease, unspecified whether esophagitis present     Meds ordered this encounter  Medications  . budesonide-formoterol (SYMBICORT) 160-4.5 MCG/ACT inhaler    Sig: Inhale 2 puffs into the lungs 2 (two) times daily.    Dispense:  1 Inhaler    Refill:  5  . fluticasone (FLONASE) 50 MCG/ACT nasal spray    Sig: Place 2 sprays into both nostrils daily.    Dispense:  1 g    Refill:  5  . famotidine (PEPCID) 20 MG tablet    Sig: Take 1 tablet (20 mg total) by mouth 2 (two) times daily as needed for  heartburn or indigestion.    Dispense:  60 tablet    Refill:  5    Patient Instructions  Asthma Begin prednisone 10 mg tablets. Take 2 tablets twice a day for 3 days, then take 2 tablets once a day for 1 day, then take 1 tablet on the 5th day, then stop Begin Symbicort 160-2 puffs twice a day with a spacer to prevent cough or wheeze Continue albuterol inhaler 2 puffs every 4 hours as needed for cough or wheeze.  You may use albuterol 2 puffs 5-15 minutes before activity to decrease cough or wheeze.  Reflux Begin famotidine 20 mg twice a day to control reflux Continue dietary and lifestyle modifications as listed below  Allergic rhinitis Begin Flonase 2 sprays in each nostril once a day as needed for a stuffy nose.  In the right nostril, point the applicator out toward the right ear. In the left nostril, point the applicator out toward the left ear Begin saline nasal rinses as needed for nasal symptoms. Use this before any medicated nasal sprays for best result For thick post nasal drainage, begin Mucinex [phone] mg twice a day to thin mucus secretions  Allergic conjunctivitis Some over the counter eye drops include Pataday one drop in each eye once a day as needed for red, itchy eyes OR Zaditor one drop in each eye twice a day as needed for red itchy eyes.  Tobacco use Smoking cessation discussed and written information provided  Follow up in 2 months or sooner if needed   Return in about 2 months (around 08/08/2020), or if symptoms worsen or fail to improve.    Thank you for the opportunity to care for this patient.  Please do not hesitate to contact me with questions.  Thermon Leyland, FNP [facility]  ________________________________________________  I have provided oversight concerning [name]'s evaluation and treatment of this patient's health issues addressed during today's encounter.  I agree with the assessment and therapeutic plan as  outlined in the note.   Signed,   R Jorene Guest, MD

## 2020-06-08 NOTE — Patient Instructions (Addendum)
Asthma Begin prednisone 10 mg tablets. Take 2 tablets twice a day for 3 days, then take 2 tablets once a day for 1 day, then take 1 tablet on the 5th day, then stop Begin Symbicort 160-2 puffs twice a day with a spacer to prevent cough or wheeze Continue albuterol inhaler 2 puffs every 4 hours as needed for cough or wheeze.  You may use albuterol 2 puffs 5-15 minutes before activity to decrease cough or wheeze.  Reflux Begin famotidine 20 mg twice a day to control reflux Continue dietary and lifestyle modifications as listed below  Allergic rhinitis Begin Flonase 2 sprays in each nostril once a day as needed for a stuffy nose.  In the right nostril, point the applicator out toward the right ear. In the left nostril, point the applicator out toward the left ear Begin saline nasal rinses as needed for nasal symptoms. Use this before any medicated nasal sprays for best result For thick post nasal drainage, begin Mucinex [phone] mg twice a day to thin mucus secretions  Allergic conjunctivitis Some over the counter eye drops include Pataday one drop in each eye once a day as needed for red, itchy eyes OR Zaditor one drop in each eye twice a day as needed for red itchy eyes.  Tobacco use Smoking cessation discussed and written information provided  Follow up in 2 months or sooner if needed

## 2021-04-25 ENCOUNTER — Encounter: Payer: Self-pay | Admitting: Allergy & Immunology

## 2021-04-25 ENCOUNTER — Other Ambulatory Visit: Payer: Self-pay

## 2021-04-25 ENCOUNTER — Ambulatory Visit (INDEPENDENT_AMBULATORY_CARE_PROVIDER_SITE_OTHER): Payer: Medicaid Other | Admitting: Family Medicine

## 2021-04-25 VITALS — BP 108/80 | HR 81 | Temp 97.6°F | Resp 16 | Ht 64.5 in | Wt 239.2 lb

## 2021-04-25 DIAGNOSIS — K219 Gastro-esophageal reflux disease without esophagitis: Secondary | ICD-10-CM | POA: Diagnosis not present

## 2021-04-25 DIAGNOSIS — Z720 Tobacco use: Secondary | ICD-10-CM

## 2021-04-25 DIAGNOSIS — H1013 Acute atopic conjunctivitis, bilateral: Secondary | ICD-10-CM | POA: Diagnosis not present

## 2021-04-25 DIAGNOSIS — J3089 Other allergic rhinitis: Secondary | ICD-10-CM

## 2021-04-25 DIAGNOSIS — J4541 Moderate persistent asthma with (acute) exacerbation: Secondary | ICD-10-CM | POA: Diagnosis not present

## 2021-04-25 MED ORDER — ALBUTEROL SULFATE (2.5 MG/3ML) 0.083% IN NEBU: 2.5000 mg | INHALATION_SOLUTION | Freq: Four times a day (QID) | RESPIRATORY_TRACT | 10 refills | Status: AC | PRN

## 2021-04-25 MED ORDER — FAMOTIDINE 20 MG PO TABS: 20.0000 mg | ORAL_TABLET | Freq: Two times a day (BID) | ORAL | 5 refills | Status: AC

## 2021-04-25 MED ORDER — FLUTICASONE PROPIONATE 50 MCG/ACT NA SUSP: 2.0000 | Freq: Every day | NASAL | 5 refills | Status: AC

## 2021-04-25 MED ORDER — OLOPATADINE HCL 0.1 % OP SOLN: OPHTHALMIC | 5 refills | Status: DC

## 2021-04-25 MED ORDER — MONTELUKAST SODIUM 10 MG PO TABS: ORAL_TABLET | ORAL | 10 refills | Status: AC

## 2021-04-25 MED ORDER — ALBUTEROL SULFATE HFA 108 (90 BASE) MCG/ACT IN AERS: INHALATION_SPRAY | RESPIRATORY_TRACT | 1 refills | Status: AC

## 2021-04-25 MED ORDER — BREZTRI AEROSPHERE 160-9-4.8 MCG/ACT IN AERO: INHALATION_SPRAY | RESPIRATORY_TRACT | 5 refills | Status: AC

## 2021-04-25 NOTE — Patient Instructions (Addendum)
Asthma Begin Breztri 2 puffs twice a day with a spacer to prevent cough or wheeze Begin montelukast 10 mg once a day to prevent cough or wheeze Begin prednisone 10 mg tablets. Take 2 tablets twice a day for 3 days, then take 2 tablets once a day for 1 day, then take 1 tablet on the 5th day, then stop Continue albuterol 2 puffs every 4 hours as needed for cough or wheeze OR Instead use albuterol 0.083% solution via nebulizer one unit vial every 4 hours as needed for cough or wheeze You may use albuterol 2 puffs 5-15 minutes before activity to decrease cough or wheeze. Your latest lab work qualifies you for a biologic medication to control your asthma. Written information provided  Allergic rhinitis Begin Flonase 2 sprays in each nostril once a day as needed for a stuffy nose.  In the right nostril, point the applicator out toward the right ear. In the left nostril, point the applicator out toward the left ear Begin saline nasal rinses as needed for nasal symptoms. Use this before any medicated nasal sprays for best result Begin Mucinex [phone] mg twice a day to thin mucus secretions  Allergic conjunctivitis Begin Pataday one drop in each eye once a day as needed for red, itchy eyes  Reflux Restart famotidine 20 mg twice a day to control reflux Continue dietary and lifestyle modifications as listed below  Tobacco use Smoking cessation discussed and written information provided  Follow up in 1 month or sooner if needed

## 2021-04-25 NOTE — Progress Notes (Signed)
[address] [place] [address] Dept: [phone]  FOLLOW UP NOTE  Patient ID: Deborah Walters, female    DOB: 1991-06-01  Age: 30 y.o. MRN: [id number] Date of Office Visit: 04/25/2021  Assessment  Chief Complaint: Breathing Problem (SOB, hospital visit)  HPI Deborah Walters is a 30 year old female who presents to the clinic for evaluation of acute asthma exacerbation.  She was last seen in this clinic 06/08/2020 for evaluation of asthma, allergic rhinitis, allergic conjunctivitis, reflux, and tobacco use.  In the interim, she visited the emergency department on 04/19/2021 for shortness of breath, chills, and hypertension. At that visit, chest x-ray indicated no acute cardiopulmonary abnormality, influenza A was negative, influenza B was negative, RSV PCR was negative, and COVID was not detected.  Absolute eosinophil count was 0.2. She was discharged to home with an oral steroid taper. At today's visit, she reports her asthma remains poorly controlled with chest tightness, shortness of breath, coughing, wheezing during activity, and cough which is reported as dry and productive at times.  She reports that she has restarted Symbicort 160-2 puffs twice a day on Sunday and is using albuterol about 2 times an hour with minimal relief of symptoms.  Prior to Sunday, she reports rare use of Symbicort or albuterol. She also reports that she feels as though the Symbicort inhaler may be stopped up as she thinks no medication is coming out of the device. She has received a steroid taper three times in the last year. Allergic rhinitis is reported as poorly controlled with symptoms including nasal congestion sneezing, and postnasal drainage with frequent throat clearing.  Allergic conjunctivitis is reported as well controlled with no medical intervention.  Reflux is reported as well controlled with occasional famotidine.  She continues to smoke cigarettes half a pack a day.  She reports that she has been smoking 2 to  3 cigarettes since she has had shortness of breath beginning on Sunday.  She denies fever, sweats, and chills.  She reports her daughter got a cold at daycare last week.  She denies any other sick contacts.  Her current medications are listed in the chart. Of note, the patient is not currently pregnant.    Drug Allergies:  No Known Allergies  Physical Exam: BP 108/80 (BP Location: Left Arm, Patient Position: Sitting)   Pulse 81   Temp 97.6 F (36.4 C) (Temporal)   Resp 16   Ht 5' 4.5" (1.638 m)   Wt 239 lb 3.2 oz (108.5 kg)   SpO2 95%   BMI 40.42 kg/m    Physical Exam Vitals reviewed.  Constitutional:      Appearance: Normal appearance.  HENT:     Head: Normocephalic and atraumatic.     Right Ear: Tympanic membrane normal.     Left Ear: Tympanic membrane normal.     Nose:     Comments: Bilateral nares grossly edematous with thick clear nasal drainage noted.  Pharynx slightly erythematous with no exudate.  Ears normal.  Eyes normal. Eyes:     Conjunctiva/sclera: Conjunctivae normal.  Cardiovascular:     Rate and Rhythm: Normal rate and regular rhythm.     Heart sounds: Normal heart sounds. No murmur heard.   Pulmonary:     Effort: Pulmonary effort is normal.     Breath sounds: Normal breath sounds.     Comments: Slight scattered bilateral expiratory wheeze which remained post bronchodilator therapy Musculoskeletal:        General: Normal range of motion.  Cervical back: Normal range of motion and neck supple.  Skin:    General: Skin is warm and dry.  Neurological:     Mental Status: She is alert and oriented to person, place, and time.  Psychiatric:        Mood and Affect: Mood normal.        Behavior: Behavior normal.        Thought Content: Thought content normal.        Judgment: Judgment normal.     Diagnostics: FVC 1.96, FEV1 1.25.  Predicted FVC 3.88, predicted FEV1 3.30.  Spirometry indicates severe obstruction and severe restriction.   Postbronchodilator FVC 2.29, FEV1 1.53.  Postbronchodilator spirometry indicates 17% improvement in FVC and 22% improvement in FEV1  Assessment and Plan: 1. Moderate persistent asthma with acute exacerbation   2. Tobacco use   3. Gastroesophageal reflux disease, unspecified whether esophagitis present   4. Allergic conjunctivitis of both eyes   5. Other allergic rhinitis     Meds ordered this encounter  Medications  . albuterol (VENTOLIN HFA) 108 (90 Base) MCG/ACT inhaler    Sig: 1-2 puffs every 4-6 hours as needed for coughing or wheezing    Dispense:  1 each    Refill:  1  . famotidine (PEPCID) 20 MG tablet    Sig: Take 1 tablet (20 mg total) by mouth 2 (two) times daily.    Dispense:  60 tablet    Refill:  5  . Budeson-Glycopyrrol-Formoterol (BREZTRI AEROSPHERE) 160-9-4.8 MCG/ACT AERO    Sig: 2 puffs twice a day with a spacer to prevent cough or wheezing.    Dispense:  10.7 g    Refill:  5  . albuterol (PROVENTIL) (2.5 MG/3ML) 0.083% nebulizer solution    Sig: Take 3 mLs (2.5 mg total) by nebulization every 6 (six) hours as needed for wheezing or shortness of breath.    Dispense:  75 mL    Refill:  10  . olopatadine (PATADAY) 0.1 % ophthalmic solution    Sig: One droop in each eye once a day as needed for itchy eyes    Dispense:  5 mL    Refill:  5  . montelukast (SINGULAIR) 10 MG tablet    Sig: 1 tablet nce daily to prevent cough and wheezing.    Dispense:  34 tablet    Refill:  10  . fluticasone (FLONASE) 50 MCG/ACT nasal spray    Sig: Place 2 sprays into both nostrils daily.    Dispense:  1 g    Refill:  5    Patient Instructions  Asthma Begin Breztri 2 puffs twice a day with a spacer to prevent cough or wheeze Begin montelukast 10 mg once a day to prevent cough or wheeze Begin prednisone 10 mg tablets. Take 2 tablets twice a day for 3 days, then take 2 tablets once a day for 1 day, then take 1 tablet on the 5th day, then stop Continue albuterol 2 puffs every 4  hours as needed for cough or wheeze OR Instead use albuterol 0.083% solution via nebulizer one unit vial every 4 hours as needed for cough or wheeze You may use albuterol 2 puffs 5-15 minutes before activity to decrease cough or wheeze. Your latest lab work qualifies you for a biologic medication to control your asthma. Written information provided  Allergic rhinitis Begin Flonase 2 sprays in each nostril once a day as needed for a stuffy nose.  In the right nostril, point the applicator  out toward the right ear. In the left nostril, point the applicator out toward the left ear Begin saline nasal rinses as needed for nasal symptoms. Use this before any medicated nasal sprays for best result Begin Mucinex [id number] mg twice a day to thin mucus secretions  Allergic conjunctivitis Begin Pataday one drop in each eye once a day as needed for red, itchy eyes  Reflux Restart famotidine 20 mg twice a day to control reflux Continue dietary and lifestyle modifications as listed below  Tobacco use Smoking cessation discussed and written information provided  Follow up in 1 month or sooner if needed   Return in about 4 weeks (around 05/23/2021), or if symptoms worsen or fail to improve.    Thank you for the opportunity to care for this patient.  Please do not hesitate to contact me with questions.  Thermon Leyland, FNP [facility]

## 2021-05-13 ENCOUNTER — Other Ambulatory Visit: Payer: Self-pay | Admitting: *Deleted

## 2021-05-13 MED ORDER — OLOPATADINE HCL 0.2 % OP SOLN: OPHTHALMIC | 5 refills | Status: AC
